# Patient Record
Sex: Female | Born: 1990 | Hispanic: Yes | Marital: Married | Smoking: Former smoker
Health system: Southern US, Community
[De-identification: ages and names within clinical notes are randomized; demographics above are authoritative.]

## PROBLEM LIST (undated history)

## (undated) ENCOUNTER — Inpatient Hospital Stay: Payer: Self-pay

## (undated) DIAGNOSIS — B9689 Other specified bacterial agents as the cause of diseases classified elsewhere: Secondary | ICD-10-CM

## (undated) DIAGNOSIS — A749 Chlamydial infection, unspecified: Secondary | ICD-10-CM

## (undated) DIAGNOSIS — E669 Obesity, unspecified: Secondary | ICD-10-CM

## (undated) DIAGNOSIS — N760 Acute vaginitis: Secondary | ICD-10-CM

## (undated) HISTORY — DX: Obesity, unspecified: E66.9

## (undated) HISTORY — DX: Chlamydial infection, unspecified: A74.9

## (undated) HISTORY — PX: CHOLECYSTECTOMY: SHX55

---
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
## 2008-06-22 ENCOUNTER — Emergency Department (HOSPITAL_COMMUNITY): Admission: EM | Admit: 2008-06-22 | Discharge: 2008-06-22 | Payer: Self-pay | Admitting: Emergency Medicine

## 2009-12-12 ENCOUNTER — Emergency Department: Payer: Self-pay | Admitting: Emergency Medicine

## 2009-12-22 ENCOUNTER — Emergency Department: Payer: Self-pay | Admitting: Emergency Medicine

## 2011-01-23 ENCOUNTER — Emergency Department: Payer: Self-pay | Admitting: Emergency Medicine

## 2011-03-28 NOTE — Op Note (Signed)
NAMESHAKARIA, RAPHAEL              ACCOUNT NO.:  [id number]   MEDICAL RECORD NO.:  [id number]          PATIENT TYPE:  MAT   LOCATION:  [facility]                          FACILITY:  [facility]   PHYSICIAN:  Phil D. Okey Dupre, M.D.     DATE OF BIRTH:  June 27, 1991   DATE OF PROCEDURE:  03/14/2005  DATE OF DISCHARGE:                                 OPERATIVE REPORT   PREOPERATIVE DIAGNOSIS:  Incomplete abortion.   POSTOPERATIVE DIAGNOSIS:  Incomplete abortion.   PROCEDURE:  Dilatation and evacuation.   SURGEON:  Javier Glazier. Rose, M.D.   ESTIMATED BLOOD LOSS:  Less than 10 cc.   SPECIMENS:  Products of conception sent to pathology.   ANESTHESIA:  MAC plus local.   POSTOPERATIVE CONDITION:  Satisfactory.   DESCRIPTION OF PROCEDURE:  Under satisfactory MAC sedation, the perineum was  prepped and draped in the usual sterile manner.   Bimanual pelvic examination revealed the uterus about [redacted] weeks gestational  size, retroverted, freely moveable, with normal free adnexa and cervix that  easily admitted a fingertip.  A weighted speculum was placed in the  posterior fourchette of the bladder.  BUS within normal limits.  The vagina  was clean and well-rugated.  There was a moderate amount of blood in the  vaginal vault.  The cervix was dilated to approximately 1 cm.  The uterine  cavity was sounded to a depth of 9 cm and the cervical os dilated to a #10  Hegar dilator which was easily inserted.  A suction curette of #10 was used  to evacuate the remaining endometrial contents which was sent for  pathological diagnosis.  There was minimal bleeding during the procedure.  Prior to the curettage, 10 cc of 1% Xylocaine was placed in each of the  lateral paracervical areas of the cervix for patient comfort.   The patient was transferred to the recovery room in satisfactory condition.      PDR/MEDQ  D:  03/14/2005  T:  03/14/2005  Job:  [id number]

## 2011-10-06 ENCOUNTER — Observation Stay: Payer: Self-pay | Admitting: Obstetrics and Gynecology

## 2011-11-22 ENCOUNTER — Inpatient Hospital Stay: Payer: Self-pay | Admitting: Obstetrics & Gynecology

## 2011-11-22 LAB — CBC WITH DIFFERENTIAL/PLATELET
Basophil #: 0 10*3/uL (ref 0.0–0.1)
Eosinophil #: 0 10*3/uL (ref 0.0–0.7)
HCT: 35.6 % (ref 35.0–47.0)
HGB: 12 g/dL (ref 12.0–16.0)
Lymphocyte #: 1.1 10*3/uL (ref 1.0–3.6)
MCV: 93 fL (ref 80–100)
Monocyte #: 0.4 10*3/uL (ref 0.0–0.7)
Neutrophil %: 82.1 %
Platelet: 157 10*3/uL (ref 150–440)
RBC: 3.85 10*6/uL (ref 3.80–5.20)
RDW: 14.8 % — ABNORMAL HIGH (ref 11.5–14.5)
WBC: 8.8 10*3/uL (ref 3.6–11.0)

## 2012-05-31 ENCOUNTER — Emergency Department: Payer: Self-pay | Admitting: Emergency Medicine

## 2012-05-31 LAB — URINALYSIS, COMPLETE
Bacteria: NONE SEEN
Blood: NEGATIVE
Glucose,UR: NEGATIVE mg/dL (ref 0–75)
Nitrite: NEGATIVE
Protein: NEGATIVE

## 2013-03-24 ENCOUNTER — Emergency Department: Payer: Self-pay | Admitting: Emergency Medicine

## 2013-03-24 LAB — CBC WITH DIFFERENTIAL/PLATELET
Basophil #: 0 10*3/uL (ref 0.0–0.1)
Eosinophil #: 0 10*3/uL (ref 0.0–0.7)
HGB: 13.4 g/dL (ref 12.0–16.0)
Lymphocyte %: 11.2 %
MCV: 89 fL (ref 80–100)
Neutrophil %: 85.1 %
RBC: 4.41 10*6/uL (ref 3.80–5.20)

## 2013-03-24 LAB — COMPREHENSIVE METABOLIC PANEL
Albumin: 3.8 g/dL (ref 3.4–5.0)
BUN: 16 mg/dL (ref 7–18)
Calcium, Total: 9.1 mg/dL (ref 8.5–10.1)
Creatinine: 0.81 mg/dL (ref 0.60–1.30)
EGFR (Non-African Amer.): >60
Glucose: 117 mg/dL — ABNORMAL HIGH (ref 65–99)
Osmolality: 274 (ref 275–301)
Potassium: 3.5 mmol/L (ref 3.5–5.1)
SGPT (ALT): 22 U/L (ref 12–78)

## 2013-03-24 LAB — URINALYSIS, COMPLETE
Bilirubin,UR: NEGATIVE
Blood: NEGATIVE
Glucose,UR: NEGATIVE mg/dL (ref 0–75)
Ketone: NEGATIVE
Ph: 5 (ref 4.5–8.0)
Protein: NEGATIVE
RBC,UR: NONE SEEN /HPF (ref 0–5)
Squamous Epithelial: 2
WBC UR: 3 /HPF (ref 0–5)

## 2013-03-24 LAB — LIPASE, BLOOD: Lipase: 174 U/L (ref 73–393)

## 2013-03-24 LAB — PREGNANCY, URINE: Pregnancy Test, Urine: NEGATIVE m[IU]/mL

## 2013-04-13 ENCOUNTER — Ambulatory Visit: Payer: Self-pay | Admitting: Surgery

## 2014-03-23 ENCOUNTER — Emergency Department: Payer: Self-pay | Admitting: Emergency Medicine

## 2014-10-25 ENCOUNTER — Emergency Department: Payer: Self-pay | Admitting: Emergency Medicine

## 2014-11-04 ENCOUNTER — Emergency Department: Payer: Self-pay | Admitting: Emergency Medicine

## 2015-03-02 NOTE — Op Note (Signed)
PATIENT NAME:  Anne Cochran, Anne Cochran MR#:  [id number] DATE OF BIRTH:  [birth year]  ATTENDING PHYSICIAN:  Dr. Salome Holmeshris Jerrian Mells  DATE OF SURGERY:  04/13/2013  PREOPERATIVE DIAGNOSIS:  Symptomatic cholelithiasis.   POSTOPERATIVE DIAGNOSIS:  Symptomatic cholelithiasis.   PROCEDURE PERFORMED:  Laparoscopic cholecystectomy.   ANESTHESIA:  General.   ESTIMATED BLOOD LOSS:  5 mL.   COMPLICATIONS:  None.   SPECIMEN:  Gallbladder.   INDICATION FOR SURGERY:  Anne Cochran is a pleasant, 24 year old female with history of right upper quadrant pain and gallstones. We believe that her pain was biliary in nature, and thus brought her to the Operating Room for laparoscopic cholecystectomy.   DETAILS OF PROCEDURE ARE AS FOLLOWS:  Informed consent was obtained. Anne Cochran was brought to the Operating Room suite. She was laid supine on the Operating Room table. She was induced, endotracheal tube was placed, general anesthesia was administered. Her abdomen was prepped and draped in standard surgical fashion. A timeout was then performed correctly identifying the patient name, operative site and procedure to be performed. A supraumbilical incision was made. This was deepened down to the fascia. The fascia was incised. The peritoneum was entered. Two stay sutures were placed through the supraumbilical fascia. A Hassan trocar was placed through the fasciotomy The abdomen was insufflated. The gallbladder was evaluated. It did not appear to be acutely inflamed. An 11 mm epigastric port was placed under direct visualization. Two 5 mm subcostal ports were placed at the midclavicular and anterior axillary line. The gallbladder was then retracted over the dome of the liver. The cystic duct and cystic artery were dissected out. There was not one dominant artery, but the critical view was obtained. The cystic duct was clipped and ligated. The smaller cystic artery branches were ligated using electrocautery. The gallbladder was then  taken off the gallbladder fossa. The gallbladder was then taken out with an Endo Catch bag through the supraumbilical port. The gallbladder fossa was examined. It was made hemostatic with bovie electrocautery. The abdomen was irrigated. The supraumbilical port was closed using a 0 Vicryl figure-of-eight. The skin was then closed using a deep dermal 4-0 Monocryl interrupted sutures. Steri-Strips, Telfa gauze and Tegaderm were then placed over the wounds to complete the dressing. The patient was then awoken, extubated and brought to the postanesthesia care unit. There were no immediate complications. Needle, sponge and instrument count was correct at the end of the procedure.    ____________________________ Si Raiderhristopher A. Taletha Twiford, MD cal:mr D: 04/13/2013 15:07:54 ET T: 04/13/2013 19:14:38 ET JOB#: [id number]  cc: Cristal Deerhristopher A. Kynlei Piontek, MD, <Dictator> Jarvis NewcomerHRISTOPHER A Roland Lipke MD ELECTRONICALLY SIGNED 04/18/2013 20:57

## 2015-03-20 NOTE — H&P (Signed)
L&D Evaluation:  History Expanded:   HPI 24 yo G3P1011 at 38+ weeks [facility]02/2011 presenting with spontaneous rupture of membranes at 1230, min contractions.  +FM. Prior delivery was a term singleton.  Her pregnancy has been uncomplicated other than two prior UTI's.    Presents with abdominal pain    Patient's Medical History No Chronic Illness    Patient's Surgical History none  Previous C-Section    Medications Pre Natal Vitamins    Allergies PCN, Sulfa    Social History none    Family History Non-Contributory   ROS:   ROS All systems were reviewed.  HEENT, CNS, GI, GU, Respiratory, CV, Renal and Musculoskeletal systems were found to be normal.   Exam:   Vital Signs stable    General no apparent distress    Mental Status clear    Chest clear    Heart normal sinus rhythm, no murmur/gallop/rubs    Abdomen gravid, non-tender    Estimated Fetal Weight Average for gestational age    Back no CVAT    Edema no edema    Pelvic no external lesions, 3/75/-3, VTX    Mebranes Ruptured    FHT normal rate with no decels, 140    Ucx absent    Skin dry   Impression:   Impression 24 yo G3P1011 with spontaneous rupture of membranes at term   Plan:   Plan EFM/NST, monitor contractions and for cervical change, fluids    Comments Pitocin as no contractions seen or felt Analgesia as needed Anticipate vag delivery   Electronic Signatures: Letitia LibraHarris, Robert Paul (MD)  (Signed 12-Jan-13 15:43)  Authored: L&D Evaluation   Last Updated: 12-Jan-13 15:43 by Letitia LibraHarris, Robert Paul (MD)

## 2016-03-06 ENCOUNTER — Emergency Department: Payer: Medicaid Other

## 2016-03-06 ENCOUNTER — Emergency Department
Admission: EM | Admit: 2016-03-06 | Discharge: 2016-03-07 | Disposition: A | Discharge status: Home / Self Care | Payer: Medicaid Other | Attending: Emergency Medicine | Admitting: Emergency Medicine

## 2016-03-06 ENCOUNTER — Encounter: Payer: Self-pay | Admitting: Emergency Medicine

## 2016-03-06 DIAGNOSIS — S93401A Sprain of unspecified ligament of right ankle, initial encounter: Secondary | ICD-10-CM

## 2016-03-06 DIAGNOSIS — Y939 Activity, unspecified: Secondary | ICD-10-CM | POA: Diagnosis not present

## 2016-03-06 DIAGNOSIS — M25571 Pain in right ankle and joints of right foot: Secondary | ICD-10-CM | POA: Diagnosis present

## 2016-03-06 DIAGNOSIS — Y929 Unspecified place or not applicable: Secondary | ICD-10-CM | POA: Insufficient documentation

## 2016-03-06 DIAGNOSIS — Y999 Unspecified external cause status: Secondary | ICD-10-CM | POA: Insufficient documentation

## 2016-03-06 DIAGNOSIS — W1849XA Other slipping, tripping and stumbling without falling, initial encounter: Secondary | ICD-10-CM | POA: Insufficient documentation

## 2016-03-06 MED ORDER — NAPROXEN 500 MG PO TABS: 500.0000 mg | ORAL_TABLET | Freq: Two times a day (BID) | ORAL | Status: DC

## 2016-03-06 NOTE — ED Provider Notes (Signed)
[facility] Emergency Department Provider Note  ____________________________________________  Time seen: Approximately 11:49 PM  I have reviewed the triage vital signs and the nursing notes.   HISTORY  Chief Complaint Ankle Pain    HPI Anne HotterJessica Cochran is a 25 y.o. female , NAD, presents to the emergency department with one-day history of right ankle pain. States she slipped yesterday and twisted the right ankle. Has had persistent pain, swelling and bruising about the outside of the right ankle and foot. Denies any numbness, weakness, tingling. Has not treated her injury at home at this time. Denies any open wounds or lacerations.   History reviewed. No pertinent past medical history.  There are no active problems to display for this patient.   Past Surgical History  Procedure Laterality Date  . Cholecystectomy      Current Outpatient Rx  Name  Route  Sig  Dispense  Refill  . naproxen (NAPROSYN) 500 MG tablet   Oral   Take 1 tablet (500 mg total) by mouth 2 (two) times daily with a meal.   14 tablet   0     Allergies Penicillins  No family history on file.  Social History Social History  Substance Use Topics  . Smoking status: Never Smoker   . Smokeless tobacco: None  . Alcohol Use: No     Review of Systems  Constitutional: No fatigue Eyes: No visual changes.  Cardiovascular: No chest pain. Respiratory:  No shortness of breath.  Gastrointestinal: No abdominal pain.  No nausea, vomiting.  Musculoskeletal: Positive right ankle and foot pain.  Skin: Positive swelling, bruising about right ankle and foot. Negative for rash, open wounds, lacerations. Neurological: Negative for headaches, focal weakness or numbness. No tingling. 10-point ROS otherwise negative.  ____________________________________________   PHYSICAL EXAM:  VITAL SIGNS: ED Triage Vitals  Enc Vitals Group     BP 03/06/16 2319 118/74 mmHg     Pulse Rate 03/06/16  2319 88     Resp 03/06/16 2319 20     Temp 03/06/16 2319 97.9 F (36.6 C)     Temp Source 03/06/16 2319 Oral     SpO2 03/06/16 2319 100 %     Weight 03/06/16 2319 214 lb (97.07 kg)     Height 03/06/16 2319 5\' 2"  (1.575 m)     Head Cir --      Peak Flow --      Pain Score 03/06/16 2317 4     Pain Loc --      Pain Edu? --      Excl. in GC? --      Constitutional: Alert and oriented. Well appearing and in no acute distress. Eyes: Conjunctivae are normal. Head: Atraumatic. Cardiovascular: Good peripheral circulation with 2+ pulses noted in the right lower extremity. Capillary refill less than 3 seconds in all digits of the right lower extremity. Respiratory: Normal respiratory effort without tachypnea or retractions. Musculoskeletal: Pain to palpation about the lateral right ankle. Full range of motion of the right ankle with some pain with inversion and full flexion. No laxity with anterior and posterior drawer. No laxity with varus or valgus stress. Ocean of the right toes. No lower extremity edema.  No joint effusions. Neurologic:  Normal speech and language. No gross focal neurologic deficits are appreciated. Sensation is grossly intact about the right lower extremity. Skin:  Skin about the lateral portion of the right ankle is moderately swollen with diffuse blue ecchymosis. No open wounds or lesions are  noted. Skin is warm, dry and intact. No rash noted. Psychiatric: Mood and affect are normal. Speech and behavior are normal. Patient exhibits appropriate insight and judgement.   ____________________________________________   LABS  None  ____________________________________________  EKG  None ____________________________________________  RADIOLOGY I have personally viewed and evaluated these images (plain radiographs) as part of my medical decision making, as well as reviewing the written report by the radiologist.  Dg Ankle Complete Right  03/06/2016  CLINICAL DATA:   Patient slipped and twisted right ankle. Pain with bruising. Initial encounter. EXAM: RIGHT ANKLE - COMPLETE 3+ VIEW COMPARISON:  None. FINDINGS: Soft tissue swelling overlying the lateral malleolus. Corticated ossific density about the inferior aspect of the lateral malleolus, likely sequelae of prior fracture. Talar dome is intact. No evidence for acute fracture. IMPRESSION: Soft tissue swelling overlying the lateral malleolus. No evidence for acute fracture. Corticated ossific density about the inferior aspect of the lateral malleolus, likely sequelae of remote prior trauma. Electronically Signed   By: Annia Belt M.D.   On: 03/06/2016 23:43    ____________________________________________    PROCEDURES  Procedure(s) performed: None   Medications - No data to display   ____________________________________________   INITIAL IMPRESSION / ASSESSMENT AND PLAN / ED COURSE  Pertinent imaging results that were available during my care of the patient were reviewed by me and considered in my medical decision making (see chart for details).  Patient's diagnosis is consistent with right ankle sprain. Patient will be discharged home with prescriptions for naproxen to take as directed. Patient is also advised to apply ice to the affected area 20 minutes 3-4 times daily and keep the right ankle elevated when not ambulating. Patient was placed in an Ace wrap and given crutches to assist in ambulation. Patient is to follow up with Dr. Lutricia Horsfall in orthopedics if no improvement noted within 5-7 days. Patient is given ED precautions to return to the ED for any worsening or new symptoms.      ____________________________________________  FINAL CLINICAL IMPRESSION(S) / ED DIAGNOSES  Final diagnoses:  Right ankle sprain, initial encounter      NEW MEDICATIONS STARTED DURING THIS VISIT:  New Prescriptions   NAPROXEN (NAPROSYN) 500 MG TABLET    Take 1 tablet (500 mg total) by mouth 2 (two) times  daily with a meal.         Hope Pigeon, PA-C 03/07/16 0001  Jeanmarie Plant, MD 03/07/16 2326

## 2016-03-06 NOTE — Discharge Instructions (Signed)
Ankle Sprain °An ankle sprain is an injury to the strong, fibrous tissues (ligaments) that hold the bones of your ankle joint together.  °CAUSES °An ankle sprain is usually caused by a fall or by twisting your ankle. Ankle sprains most commonly occur when you step on the outer edge of your foot, and your ankle turns inward. People who participate in sports are more prone to these types of injuries.  °SYMPTOMS  °· Pain in your ankle. The pain may be present at rest or only when you are trying to stand or walk. °· Swelling. °· Bruising. Bruising may develop immediately or within 1 to 2 days after your injury. °· Difficulty standing or walking, particularly when turning corners or changing directions. °DIAGNOSIS  °Your caregiver will ask you details about your injury and perform a physical exam of your ankle to determine if you have an ankle sprain. During the physical exam, your caregiver will press on and apply pressure to specific areas of your foot and ankle. Your caregiver will try to move your ankle in certain ways. An X-ray exam may be done to be sure a bone was not broken or a ligament did not separate from one of the bones in your ankle (avulsion fracture).  °TREATMENT  °Certain types of braces can help stabilize your ankle. Your caregiver can make a recommendation for this. Your caregiver may recommend the use of medicine for pain. If your sprain is severe, your caregiver may refer you to a surgeon who helps to restore function to parts of your skeletal system (orthopedist) or a physical therapist. °HOME CARE INSTRUCTIONS  °· Apply ice to your injury for 1-2 days or as directed by your caregiver. Applying ice helps to reduce inflammation and pain. °· Put ice in a plastic bag. °· Place a towel between your skin and the bag. °· Leave the ice on for 15-20 minutes at a time, every 2 hours while you are awake. °· Only take over-the-counter or prescription medicines for pain, discomfort, or fever as directed by  your caregiver. °· Elevate your injured ankle above the level of your heart as much as possible for 2-3 days. °· If your caregiver recommends crutches, use them as instructed. Gradually put weight on the affected ankle. Continue to use crutches or a cane until you can walk without feeling pain in your ankle. °· If you have a plaster splint, wear the splint as directed by your caregiver. Do not rest it on anything harder than a pillow for the first 24 hours. Do not put weight on it. Do not get it wet. You may take it off to take a shower or bath. °· You may have been given an elastic bandage to wear around your ankle to provide support. If the elastic bandage is too tight (you have numbness or tingling in your foot or your foot becomes cold and blue), adjust the bandage to make it comfortable. °· If you have an air splint, you may blow more air into it or let air out to make it more comfortable. You may take your splint off at night and before taking a shower or bath. Wiggle your toes in the splint several times per day to decrease swelling. °SEEK MEDICAL CARE IF:  °· You have rapidly increasing bruising or swelling. °· Your toes feel extremely cold or you lose feeling in your foot. °· Your pain is not relieved with medicine. °SEEK IMMEDIATE MEDICAL CARE IF: °· Your toes are numb or blue. °·   You have severe pain that is increasing. °MAKE SURE YOU:  °· Understand these instructions. °· Will watch your condition. °· Will get help right away if you are not doing well or get worse. °  °This information is not intended to replace advice given to you by your health care provider. Make sure you discuss any questions you have with your health care provider. °  °Document Released: 10/27/2005 Document Revised: 11/17/2014 Document Reviewed: 11/08/2011 °Elsevier Interactive Patient Education ©2016 [employer] ° °Cryotherapy °Cryotherapy is when you put ice on your injury. Ice helps lessen pain and puffiness (swelling) after an  injury. Ice works the best when you start using it in the first 24 to 48 hours after an injury. °HOME CARE °· Put a dry or damp towel between the ice pack and your skin. °· You may press gently on the ice pack. °· Leave the ice on for no more than 10 to 20 minutes at a time. °· Check your skin after 5 minutes to make sure your skin is okay. °· Rest at least 20 minutes between ice pack uses. °· Stop using ice when your skin loses feeling (numbness). °· Do not use ice on someone who cannot tell you when it hurts. This includes small children and people with memory problems (dementia). °GET HELP RIGHT AWAY IF: °· You have white spots on your skin. °· Your skin turns blue or pale. °· Your skin feels waxy or hard. °· Your puffiness gets worse. °MAKE SURE YOU:  °· Understand these instructions. °· Will watch your condition. °· Will get help right away if you are not doing well or get worse. °  °This information is not intended to replace advice given to you by your health care provider. Make sure you discuss any questions you have with your health care provider. °  °Document Released: 04/14/2008 Document Revised: 01/19/2012 Document Reviewed: 06/19/2011 °Elsevier Interactive Patient Education ©2016 [employer] ° °Elastic Bandage and RICE °WHAT DOES AN ELASTIC BANDAGE DO? °Elastic bandages come in different shapes and sizes. They generally provide support to your injury and reduce swelling while you are healing, but they can perform different functions. Your health care provider will help you to decide what is best for your protection, recovery, or rehabilitation following an injury. °WHAT ARE SOME GENERAL TIPS FOR USING AN ELASTIC BANDAGE? °· Use the bandage as directed by the maker of the bandage that you are using. °· Do not wrap the bandage too tightly. This may cut off the circulation in the arm or leg in the area below the bandage. °¨ If part of your body beyond the bandage becomes blue, numb, cold, swollen, or is  more painful, your bandage is most likely too tight. If this occurs, remove your bandage and reapply it more loosely. °· See your health care provider if the bandage seems to be making your problems worse rather than better. °· An elastic bandage should be removed and reapplied every 3-4 hours or as directed by your health care provider. °WHAT IS RICE? °The routine care of many injuries includes rest, ice, compression, and elevation (RICE therapy).  °Rest °Rest is required to allow your body to heal. Generally, you can resume your routine activities when you are comfortable and have been given permission by your health care provider. °Ice °Icing your injury helps to keep the swelling down and it reduces pain. Do not apply ice directly to your skin. °· Put ice in a plastic bag. °· Place   a towel between your skin and the bag. °· Leave the ice on for 20 minutes, 2-3 times per day. °Do this for as long as you are directed by your health care provider. °Compression °Compression helps to keep swelling down, gives support, and helps with discomfort. Compression may be done with an elastic bandage. °Elevation °Elevation helps to reduce swelling and it decreases pain. If possible, your injured area should be placed at or above the level of your heart or the center of your chest. °WHEN SHOULD I SEEK MEDICAL CARE? °You should seek medical care if: °· You have persistent pain and swelling. °· Your symptoms are getting worse rather than improving. °These symptoms may indicate that further evaluation or further X-rays are needed. Sometimes, X-rays may not show a small broken bone (fracture) until a number of days later. Make a follow-up appointment with your health care provider. Ask when your X-ray results will be ready. Make sure that you get your X-ray results. °WHEN SHOULD I SEEK IMMEDIATE MEDICAL CARE? °You should seek immediate medical care if: °· You have a sudden onset of severe pain at or below the area of your  injury. °· You develop redness or increased swelling around your injury. °· You have tingling or numbness at or below the area of your injury that does not improve after you remove the elastic bandage. °  °This information is not intended to replace advice given to you by your health care provider. Make sure you discuss any questions you have with your health care provider. °  °Document Released: 04/18/2002 Document Revised: 07/18/2015 Document Reviewed: 06/12/2014 °Elsevier Interactive Patient Education ©2016 [employer] ° °

## 2016-03-06 NOTE — ED Notes (Signed)
Pt to triage via w/c with no distress noted; st slipped and twisted right ankle PTA, c/o persistent pain

## 2016-04-23 ENCOUNTER — Encounter: Payer: Self-pay | Admitting: *Deleted

## 2016-04-23 ENCOUNTER — Emergency Department
Admission: EM | Admit: 2016-04-23 | Discharge: 2016-04-23 | Disposition: A | Discharge status: Home / Self Care | Payer: Medicaid Other | Attending: Emergency Medicine | Admitting: Emergency Medicine

## 2016-04-23 DIAGNOSIS — N760 Acute vaginitis: Secondary | ICD-10-CM | POA: Diagnosis not present

## 2016-04-23 LAB — WET PREP, GENITAL
Clue Cells Wet Prep HPF POC: NONE SEEN
SPERM: NONE SEEN
TRICH WET PREP: NONE SEEN
Yeast Wet Prep HPF POC: NONE SEEN

## 2016-04-23 LAB — CHLAMYDIA/NGC RT PCR (ARMC ONLY)
Chlamydia Tr: DETECTED — AB
N gonorrhoeae: NOT DETECTED

## 2016-04-23 MED ORDER — CEFTRIAXONE SODIUM 250 MG IJ SOLR
250.0000 mg | Freq: Once | INTRAMUSCULAR | Status: AC
  Administered 2016-04-23: 250 mg via INTRAMUSCULAR
  Filled 2016-04-23: qty 250

## 2016-04-23 MED ORDER — AZITHROMYCIN 500 MG PO TABS
1000.0000 mg | ORAL_TABLET | Freq: Once | ORAL | Status: AC
  Administered 2016-04-23: 1000 mg via ORAL
  Filled 2016-04-23: qty 2

## 2016-04-23 NOTE — Discharge Instructions (Signed)
Vaginitis °Vaginitis is an inflammation of the vagina. It is most often caused by a change in the normal balance of the bacteria and yeast that live in the vagina. This change in balance causes an overgrowth of certain bacteria or yeast, which causes the inflammation. There are different types of vaginitis, but the most common types are: °· Bacterial vaginosis. °· Yeast infection (candidiasis). °· Trichomoniasis vaginitis. This is a sexually transmitted infection (STI). °· Viral vaginitis. °· Atrophic vaginitis. °· Allergic vaginitis. °CAUSES  °The cause depends on the type of vaginitis. Vaginitis can be caused by: °· Bacteria (bacterial vaginosis). °· Yeast (yeast infection). °· A parasite (trichomoniasis vaginitis) °· A virus (viral vaginitis). °· Low hormone levels (atrophic vaginitis). Low hormone levels can occur during pregnancy, breastfeeding, or after menopause. °· Irritants, such as bubble baths, scented tampons, and feminine sprays (allergic vaginitis). °Other factors can change the normal balance of the yeast and bacteria that live in the vagina. These include: °· Antibiotic medicines. °· Poor hygiene. °· Diaphragms, vaginal sponges, spermicides, birth control pills, and intrauterine devices (IUD). °· Sexual intercourse. °· Infection. °· Uncontrolled diabetes. °· A weakened immune system. °SYMPTOMS  °Symptoms can vary depending on the cause of the vaginitis. Common symptoms include: °· Abnormal vaginal discharge. °· The discharge is white, gray, or yellow with bacterial vaginosis. °· The discharge is thick, white, and cheesy with a yeast infection. °· The discharge is frothy and yellow or greenish with trichomoniasis. °· A bad vaginal odor. °· The odor is fishy with bacterial vaginosis. °· Vaginal itching, pain, or swelling. °· Painful intercourse. °· Pain or burning when urinating. °Sometimes, there are no symptoms. °TREATMENT  °Treatment will vary depending on the type of infection.  °· Bacterial  vaginosis and trichomoniasis are often treated with antibiotic creams or pills. °· Yeast infections are often treated with antifungal medicines, such as vaginal creams or suppositories. °· Viral vaginitis has no cure, but symptoms can be treated with medicines that relieve discomfort. Your sexual partner should be treated as well. °· Atrophic vaginitis may be treated with an estrogen cream, pill, suppository, or vaginal ring. If vaginal dryness occurs, lubricants and moisturizing creams may help. You may be told to avoid scented soaps, sprays, or douches. °· Allergic vaginitis treatment involves quitting the use of the product that is causing the problem. Vaginal creams can be used to treat the symptoms. °HOME CARE INSTRUCTIONS  °· Take all medicines as directed by your caregiver. °· Keep your genital area clean and dry. Avoid soap and only rinse the area with water. °· Avoid douching. It can remove the healthy bacteria in the vagina. °· Do not use tampons or have sexual intercourse until your vaginitis has been treated. Use sanitary pads while you have vaginitis. °· Wipe from front to back. This avoids the spread of bacteria from the rectum to the vagina. °· Let air reach your genital area. °¨ Wear cotton underwear to decrease moisture buildup. °¨ Avoid wearing underwear while you sleep until your vaginitis is gone. °¨ Avoid tight pants and underwear or nylons without a cotton panel. °¨ Take off wet clothing (especially bathing suits) as soon as possible. °· Use mild, non-scented products. Avoid using irritants, such as: °¨ Scented feminine sprays. °¨ Fabric softeners. °¨ Scented detergents. °¨ Scented tampons. °¨ Scented soaps or bubble baths. °· Practice safe sex and use condoms. Condoms may prevent the spread of trichomoniasis and viral vaginitis. °SEEK MEDICAL CARE IF:  °· You have abdominal pain. °· You   have a fever or persistent symptoms for more than 2-3 days.  You have a fever and your symptoms suddenly  get worse.   This information is not intended to replace advice given to you by your health care provider. Make sure you discuss any questions you have with your health care provider.   Document Released: 08/24/2007 Document Revised: 03/13/2015 Document Reviewed: 04/08/2012 Elsevier Interactive Patient Education 2016 [employer]  Sexually Transmitted Disease A sexually transmitted disease (STD) is a disease or infection often passed to another person during sex. However, STDs can be passed through nonsexual ways. An STD can be passed through:  Spit (saliva).  Semen.  Blood.  Mucus from the vagina.  Pee (urine). HOW CAN I LESSEN MY CHANCES OF GETTING AN STD?  Use:  Latex condoms.  Water-soluble lubricants with condoms. Do not use petroleum jelly or oils.  Dental dams. These are small pieces of latex that are used as a barrier during oral sex.  Avoid having more than one sex partner.  Do not have sex with someone who has other sex partners.  Do not have sex with anyone you do not know or who is at high risk for an STD.  Avoid risky sex that can break your skin.  Do not have sex if you have open sores on your mouth or skin.  Avoid drinking too much alcohol or taking illegal drugs. Alcohol and drugs can affect your good judgment.  Avoid oral and anal sex acts.  Get shots (vaccines) for HPV and hepatitis.  If you are at risk of being infected with HIV, it is advised that you take a certain medicine daily to prevent HIV infection. This is called pre-exposure prophylaxis (PrEP). You may be at risk if:  You are a man who has sex with other men (MSM).  You are attracted to the opposite sex (heterosexual) and are having sex with more than one partner.  You take drugs with a needle.  You have sex with someone who has HIV.  Talk with your doctor about if you are at high risk of being infected with HIV. If you begin to take PrEP, get tested for HIV first. Get tested every  3 months for as long as you are taking PrEP.  Get tested for STDs every year if you are sexually active. If you are treated for an STD, get tested again 3 months after you are treated. WHAT SHOULD I DO IF I THINK I HAVE AN STD?  See your doctor.  Tell your sex partner(s) that you have an STD. They should be tested and treated.  Do not have sex until your doctor says it is okay. WHEN SHOULD I GET HELP? Get help right away if:  You have bad belly (abdominal) pain.  You are a man and have puffiness (swelling) or pain in your testicles.  You are a woman and have puffiness in your vagina.   This information is not intended to replace advice given to you by your health care provider. Make sure you discuss any questions you have with your health care provider.   Document Released: 12/04/2004 Document Revised: 11/17/2014 Document Reviewed: 04/22/2013 Elsevier Interactive Patient Education 2016 [employer] Inc[employer] Chlamydia, Female    Chlamydia is an infection. It is spread through sexual contact. Chlamydia can be in different areas of the body. These areas include the cervix, urethra, throat, or rectum. You may not know you have chlamydia because many people never develop the symptoms. Chlamydia is  not difficult to treat once you know you have it. However, if it is left untreated, chlamydia can lead to more serious health problems.  CAUSES  Chlamydia is caused by bacteria. It is a sexually transmitted disease. It is passed from an infected partner during intimate contact. This contact could be with the genitals, mouth, or rectal area. Chlamydia can also be passed from mothers to babies during birth.  SIGNS AND SYMPTOMS  There may not be any symptoms. This is often the case early in the infection. If symptoms develop, they may include:  Mild pain and discomfort when urinating.  Redness, soreness, and swelling (inflammation) of the rectum.  Vaginal discharge.  Painful intercourse.  Abdominal  pain.  Bleeding between menstrual periods. DIAGNOSIS  To diagnose this infection, your health care provider will do a pelvic exam. Cultures will be taken of the vagina, cervix, urine, and possibly the rectum to verify the diagnosis.  TREATMENT  You will be given antibiotic medicines. If you are pregnant, certain types of antibiotics will need to be avoided. Any sexual partners should also be treated, even if they do not show symptoms. Your health care provider may test you for infection again 3 months after treatment.  HOME CARE INSTRUCTIONS  Take your antibiotic medicine as directed by your health care provider. Finish the antibiotic even if you start to feel better.  Take medicines only as directed by your health care provider.  Inform any sexual partners about the infection. They should also be treated.  Do not have sexual contact until your health care provider tells you it is okay.  Get plenty of rest.  Eat a well-balanced diet.  Drink enough fluids to keep your urine clear or pale yellow.  Keep all follow-up visits as directed by your health care provider. SEEK MEDICAL CARE IF:  You have painful urination.  You have abdominal pain.  You have vaginal discharge.  You have painful sexual intercourse.  You have bleeding between periods and after sex.  You have a fever. SEEK IMMEDIATE MEDICAL CARE IF:  You experience nausea or vomiting.  You experience excessive sweating (diaphoresis).  You have difficulty swallowing. This information is not intended to replace advice given to you by your health care provider. Make sure you discuss any questions you have with your health care provider.  Document Released: 08/06/2005 Document Revised: 07/18/2015 Document Reviewed: 07/04/2013  Elsevier Interactive Patient Education [employer]    Gonorrhea  Gonorrhea is an infection that can cause serious problems. If left untreated, the infection may:  Damage the female or female organs.    Cause women to be unable to have children (sterility).  Harm a fetus if the infected woman is pregnant.  It is important to get treatment for gonorrhea as soon as possible. It is also necessary that all your sexual partners be tested for the infection.  CAUSES  Gonorrhea is caused by bacteria called Neisseria gonorrhoeae. The infection is spread from person to person, usually by sexual contact (such as by anal, vaginal, or oral means). A newborn can contract the infection from his or her mother during birth.  RISK FACTORS  Being a woman younger than 27 years of age who is sexually active.  Being a woman 34 years of age or older who has:  A new sex partner.  More than one sex partner.  A sex partner who has a sexually transmitted disease (STD). Using condoms inconsistently.  Currently having, or having previously had, an STD.  Exchanging sex or money or drugs. SYMPTOMS  Some people with gonorrhea do not have symptoms. Symptoms may be different in females and males.  Females  The most common symptoms are:  Pain in the lower abdomen.  Fever with or without chills.  Other symptoms include:  Abnormal vaginal discharge.  Painful intercourse.  Burning or itching of the vagina or lips of the vagina.  Abnormal vaginal bleeding.  Pain when urinating.  Long-lasting (chronic) pain in the lower abdomen, especially during menstruation or intercourse.  Inability to become pregnant.  Going into premature labor.  Irritation, pain, bleeding, or discharge from the rectum. This may occur if the infection was spread by anal sex.  Sore throat or swollen lymph nodes in the neck. This may occur if the infection was spread by oral sex.  Males  The most common symptoms are:  Discharge from the penis.  Pain or burning during urination.  Pain or swelling in the testicles. Other symptoms may include:  Irritation, pain, bleeding, or discharge from the rectum. This may occur if the infection was spread by  anal sex.  Sore throat, fever, or swollen lymph nodes in the neck. This may occur if the infection was spread by oral sex.  DIAGNOSIS  A diagnosis is made after a physical exam is done and a sample of discharge is examined under a microscope for the presence of the bacteria. The discharge may be taken from the urethra, cervix, throat, or rectum.  TREATMENT  Gonorrhea is treated with antibiotic medicines. It is important for treatment to begin as soon as possible. Early treatment may prevent some problems from developing. Do not have sex. Avoid all types of sexual activity for 7 days after treatment is complete and until any sex partners have been treated.  HOME CARE INSTRUCTIONS  Take medicines only as directed by your health care provider.  Take your antibiotic medicine as directed by your health care provider. Finish the antibiotic even if you start to feel better. Incomplete treatment will put you at risk for continued infection.  Do not have sex until treatment is complete or as directed by your health care provider.  Keep all follow-up visits as directed by your health care provider.  Not all test results are available during your visit. If your test results are not back during the visit, make an appointment with your health care provider to find out the results. Do not assume everything is normal if you have not heard from your health care provider or the medical facility. It is your responsibility to get your test results.  If you test positive for gonorrhea, inform your recent sexual partners. They need to be checked for gonorrhea even if they do not have symptoms. They may need treatment, even if they test negative for gonorrhea.  SEEK MEDICAL CARE IF:  You develop any bad reaction to the medicine you were prescribed. This may include:  A rash.  Nausea.  Vomiting.  Diarrhea.  Your symptoms do not improve after a few days of taking antibiotics.  Your symptoms get worse.  You develop  increased pain, such as in the testicles (for males) or in the abdomen (for females).  You have a fever. MAKE SURE YOU:  Understand these instructions.  Will watch your condition.  Will get help right away if you are not doing well or get worse. This information is not intended to replace advice given to you by your health care provider. Make sure you discuss any  questions you have with your health care provider.  Document Released: 10/24/2000 Document Revised: 11/17/2014 Document Reviewed: 05/04/2013  Elsevier Interactive Patient Education [employer]

## 2016-04-23 NOTE — ED Notes (Signed)
Pt reports discharge that is thick and clear but she does not feel "right"  Denies itching or pain

## 2016-04-23 NOTE — ED Notes (Addendum)
States she has vaginal itching and some discharge 2 days ago, white in color, states she has not tried any OTC medication

## 2016-04-23 NOTE — ED Provider Notes (Signed)
[facility] Emergency Department Provider Note  ____________________________________________  Time seen: Approximately 3:03 PM  I have reviewed the triage vital signs and the nursing notes.   HISTORY  Chief Complaint Vaginitis    HPI Anne Cochran is a 25 y.o. female , NAD, presents to the emergency department with 2 day history of vaginal itching and discharge. States she has had a thick, mucoid discharge that is abnormal. States that her vaginal area "does not feel right". Has had 2 new female sexual partners in the last 6 months and does not use protection. Is uncertain if she has been exposed to any STDs at this time. Has had negative STD screen over the last 6 months but is uncertain of when that occurred. Denies any bleeding, abdominal pain, nausea, vomiting, back pain, dysuria, hematuria. Has not had any fevers, chills, body aches.   History reviewed. No pertinent past medical history.  There are no active problems to display for this patient.   Past Surgical History  Procedure Laterality Date  . Cholecystectomy      Current Outpatient Rx  Name  Route  Sig  Dispense  Refill  . naproxen (NAPROSYN) 500 MG tablet   Oral   Take 1 tablet (500 mg total) by mouth 2 (two) times daily with a meal.   14 tablet   0     Allergies Penicillins and Sulfa antibiotics  History reviewed. No pertinent family history.  Social History Social History  Substance Use Topics  . Smoking status: Never Smoker   . Smokeless tobacco: None  . Alcohol Use: No     Review of Systems  Constitutional: No fever/chills Cardiovascular: No chest pain. Respiratory: No shortness of breath.  Gastrointestinal: No abdominal pain.  No nausea, vomiting.  No diarrhea.  Genitourinary: Positive vaginal discharge, itching. Negative for dysuria. No hematuria. No urinary hesitancy, urgency or increased frequency. Musculoskeletal: Negative for back pain.  Skin: Negative for  rash. Neurological: Negative for headaches, focal weakness or numbness. No saddle paresthesias 10-point ROS otherwise negative.  ____________________________________________   PHYSICAL EXAM:  VITAL SIGNS: ED Triage Vitals  Enc Vitals Group     BP 04/23/16 1349 129/93 mmHg     Pulse Rate 04/23/16 1349 100     Resp 04/23/16 1349 18     Temp 04/23/16 1349 98.9 F (37.2 C)     Temp Source 04/23/16 1349 Oral     SpO2 04/23/16 1349 98 %     Weight 04/23/16 1349 214 lb (97.07 kg)     Height 04/23/16 1349 5\' 2"  (1.575 m)     Head Cir --      Peak Flow --      Pain Score 04/23/16 1350 2     Pain Loc --      Pain Edu? --      Excl. in GC? --     Physical exam completed in the presence of Tammy, ED tech  Constitutional: Alert and oriented. Well appearing and in no acute distress. Eyes: Conjunctivae are normal.  Head: Atraumatic. Hematological/Lymphatic/Immunilogical: No inguinal lymphadenopathy. Cardiovascular:  Good peripheral circulation. Respiratory: Normal respiratory effort without tachypnea or retractions.  Gastrointestinal: Soft and nontenderWithout distention or guarding in all quadrants.  Genitourinary: Thick, mucoid white discharge noted throughout the entire vaginal vault and about the cervix. Negative whiff test. Vaginal mucosa is normal without any ulcerations or bleeding. No CMT. Neurologic:  Normal speech and language. No gross focal neurologic deficits are appreciated.  Skin:  Skin  is warm, dry and intact. No rash, skin sores, redness, swelling noted. Psychiatric: Mood and affect are normal. Speech and behavior are normal. Patient exhibits appropriate insight and judgement.   ____________________________________________   LABS (all labs ordered are listed, but only abnormal results are displayed)  Labs Reviewed  WET PREP, GENITAL - Abnormal; Notable for the following:    WBC, Wet Prep HPF POC TOO NUMEROUS TO COUNT (*)    All other components within normal  limits  CHLAMYDIA/NGC RT PCR [facility] ONLY)   ____________________________________________  EKG  None ____________________________________________  RADIOLOGY  None ____________________________________________    PROCEDURES  Procedure(s) performed: None   Medications  cefTRIAXone (ROCEPHIN) injection 250 mg (250 mg Intramuscular Given 04/23/16 1609)  azithromycin (ZITHROMAX) tablet 1,000 mg (1,000 mg Oral Given 04/23/16 1608)   ----------------------------------------- 4:33 PM on 04/23/2016 -----------------------------------------  Patient has tolerated both Rocephin and Zithromax without any side effects or negative outcomes. We will call her with results of her gonorrhea and chlamydia culture when they are available.  ____________________________________________   INITIAL IMPRESSION / ASSESSMENT AND PLAN / ED COURSE  Pertinent lab results that were available during my care of the patient were reviewed by me and considered in my medical decision making (see chart for details).  Patient's diagnosis is consistent with Vaginitis that is consistent with potential chlamydial infection. Patient will be discharged home with instructions to notify her sexual partners that they will need to be seen and treated at the local health department. Patient was given appropriate treatment based on CDC recommendations for gonorrhea and chlamydial infections while in the emergency department. Patient is to follow up with her primary care provider or the [facility] Department if symptoms persist past this treatment course. Patient is given ED precautions to return to the ED for any worsening or new symptoms.    ____________________________________________  FINAL CLINICAL IMPRESSION(S) / ED DIAGNOSES  Final diagnoses:  Vaginitis      NEW MEDICATIONS STARTED DURING THIS VISIT:  New Prescriptions   No medications on file         Hope Pigeon, PA-C 04/23/16  1636  Governor Rooks, MD 04/24/16 1109

## 2016-04-24 ENCOUNTER — Telehealth: Payer: Self-pay | Admitting: Emergency Medicine

## 2016-04-24 NOTE — ED Notes (Signed)
Contacted patient via telephone. Last 4 digits of SS, DOB, and date in ED used for identification. Told of positive Chlamydia results.  Was treated in ED at time of visit. Discussed partner treatment. Patient questioned when she could resume sexual activity - advised 2 weeks post treatment of self and/or partner(s).

## 2017-03-30 DIAGNOSIS — E860 Dehydration: Secondary | ICD-10-CM | POA: Diagnosis not present

## 2017-03-30 DIAGNOSIS — R197 Diarrhea, unspecified: Secondary | ICD-10-CM | POA: Insufficient documentation

## 2017-03-30 DIAGNOSIS — R112 Nausea with vomiting, unspecified: Secondary | ICD-10-CM | POA: Diagnosis present

## 2017-03-31 ENCOUNTER — Encounter: Payer: Self-pay | Admitting: Emergency Medicine

## 2017-03-31 ENCOUNTER — Emergency Department
Admission: EM | Admit: 2017-03-31 | Discharge: 2017-03-31 | Disposition: A | Discharge status: Home / Self Care | Payer: Medicaid Other | Attending: Emergency Medicine | Admitting: Emergency Medicine

## 2017-03-31 DIAGNOSIS — E860 Dehydration: Secondary | ICD-10-CM

## 2017-03-31 DIAGNOSIS — R112 Nausea with vomiting, unspecified: Secondary | ICD-10-CM

## 2017-03-31 DIAGNOSIS — R197 Diarrhea, unspecified: Secondary | ICD-10-CM

## 2017-03-31 LAB — CBC
HEMATOCRIT: 40.3 % (ref 35.0–47.0)
HEMOGLOBIN: 13.9 g/dL (ref 12.0–16.0)
MCH: 30.6 pg (ref 26.0–34.0)
MCHC: 34.4 g/dL (ref 32.0–36.0)
MCV: 89 fL (ref 80.0–100.0)
PLATELETS: 227 10*3/uL (ref 150–440)
RBC: 4.53 MIL/uL (ref 3.80–5.20)
RDW: 14.1 % (ref 11.5–14.5)
WBC: 6.4 10*3/uL (ref 3.6–11.0)

## 2017-03-31 LAB — COMPREHENSIVE METABOLIC PANEL
ALK PHOS: 68 U/L (ref 38–126)
ALT: 37 U/L (ref 14–54)
ANION GAP: 6 (ref 5–15)
AST: 29 U/L (ref 15–41)
Albumin: 4.3 g/dL (ref 3.5–5.0)
BILIRUBIN TOTAL: 0.6 mg/dL (ref 0.3–1.2)
BUN: 17 mg/dL (ref 6–20)
CALCIUM: 8.9 mg/dL (ref 8.9–10.3)
CO2: 28 mmol/L (ref 22–32)
Chloride: 105 mmol/L (ref 101–111)
Creatinine, Ser: 0.64 mg/dL (ref 0.44–1.00)
GFR calc Af Amer: >60 mL/min (ref >60)
GLUCOSE: 122 mg/dL — AB (ref 65–99)
POTASSIUM: 3.3 mmol/L — AB (ref 3.5–5.1)
Sodium: 139 mmol/L (ref 135–145)
TOTAL PROTEIN: 7.9 g/dL (ref 6.5–8.1)

## 2017-03-31 LAB — URINALYSIS, COMPLETE (UACMP) WITH MICROSCOPIC
Bacteria, UA: NONE SEEN
Bilirubin Urine: NEGATIVE
Glucose, UA: NEGATIVE mg/dL
KETONES UR: NEGATIVE mg/dL
Leukocytes, UA: NEGATIVE
Nitrite: NEGATIVE
PH: 5 (ref 5.0–8.0)
PROTEIN: 30 mg/dL — AB
Specific Gravity, Urine: 1.032 — ABNORMAL HIGH (ref 1.005–1.030)

## 2017-03-31 LAB — LIPASE, BLOOD: Lipase: 21 U/L (ref 11–51)

## 2017-03-31 LAB — POCT PREGNANCY, URINE: Preg Test, Ur: NEGATIVE

## 2017-03-31 LAB — TROPONIN I

## 2017-03-31 MED ORDER — GI COCKTAIL ~~LOC~~
30.0000 mL | Freq: Once | ORAL | Status: AC
  Administered 2017-03-31: 30 mL via ORAL
  Filled 2017-03-31: qty 30

## 2017-03-31 MED ORDER — ONDANSETRON 4 MG PO TBDP: 4.0000 mg | ORAL_TABLET | Freq: Three times a day (TID) | ORAL | 0 refills | Status: DC | PRN

## 2017-03-31 MED ORDER — ONDANSETRON HCL 4 MG/2ML IJ SOLN
4.0000 mg | Freq: Once | INTRAMUSCULAR | Status: AC
  Administered 2017-03-31: 4 mg via INTRAVENOUS
  Filled 2017-03-31: qty 2

## 2017-03-31 MED ORDER — SODIUM CHLORIDE 0.9 % IV BOLUS (SEPSIS)
1000.0000 mL | Freq: Once | INTRAVENOUS | Status: AC
  Administered 2017-03-31: 1000 mL via INTRAVENOUS

## 2017-03-31 NOTE — Discharge Instructions (Signed)
Please follow up with your primary care physician.

## 2017-03-31 NOTE — ED Triage Notes (Signed)
Pt presents to ED c/o upper abd pain , emesis, diarrhea with dizziness x3 days

## 2017-03-31 NOTE — ED Provider Notes (Signed)
[facility] Emergency Department Provider Note   ____________________________________________   First MD Initiated Contact with Patient 03/31/17 [phone]     (approximate)  I have reviewed the triage vital signs and the nursing notes.   HISTORY  Chief Complaint Abdominal Pain; Emesis; Dizziness; and Diarrhea    HPI Anne Cochran is a 26 y.o. female who comes into the hospital today with 3 days of nausea and stomach twisting. She reports that she's been unable to hold down foods or liquids and she has had some diarrhea. The patient states that the pain is in her upper abdomen and is currently a 3-4 out of 10 in intensity. The patient has never had these symptoms before. She reports her emesis initially looked like food and then looking like yellowliquid. The patient reports that her stool is also been yellow. She has had some dizziness and lightheadedness as well. The patient denies any sick contacts or fevers. She decided to come into the hospital today for further evaluation of the symptoms.   History reviewed. No pertinent past medical history.  There are no active problems to display for this patient.   Past Surgical History:  Procedure Laterality Date  . CHOLECYSTECTOMY      Prior to Admission medications   Medication Sig Start Date End Date Taking? Authorizing Provider  naproxen (NAPROSYN) 500 MG tablet Take 1 tablet (500 mg total) by mouth 2 (two) times daily with a meal. 03/06/16   Hagler, Jami L, PA-C  ondansetron (ZOFRAN ODT) 4 MG disintegrating tablet Take 1 tablet (4 mg total) by mouth every 8 (eight) hours as needed for nausea or vomiting. 03/31/17   Rebecka Apley, MD    Allergies Penicillins and Sulfa antibiotics  No family history on file.  Social History Social History  Substance Use Topics  . Smoking status: Never Smoker  . Smokeless tobacco: Not on file  . Alcohol use No    Review of Systems  Constitutional: No  fever/chills Eyes: No visual changes. ENT: No sore throat. Cardiovascular: Denies chest pain. Respiratory: Denies shortness of breath. Gastrointestinal: Nausea vomiting and abdominal pain as well as some diarrhea No constipation. Genitourinary: Negative for dysuria. Musculoskeletal: Negative for back pain. Skin: Negative for rash. Neurological: Negative for headaches, focal weakness or numbness.   ____________________________________________   PHYSICAL EXAM:  VITAL SIGNS: ED Triage Vitals  Enc Vitals Group     BP 03/31/17 0003 126/84     Pulse Rate 03/31/17 0003 84     Resp 03/31/17 0003 19     Temp 03/31/17 0003 98.3 F (36.8 C)     Temp Source 03/31/17 0003 Oral     SpO2 03/31/17 0003 99 %     Weight 03/31/17 0004 218 lb (98.9 kg)     Height 03/31/17 0004 5\' 2"  (1.575 m)     Head Circumference --      Peak Flow --      Pain Score 03/31/17 0005 5     Pain Loc --      Pain Edu? --      Excl. in GC? --     Constitutional: Alert and oriented. Well appearing and in Mild distress. Eyes: Conjunctivae are normal. PERRL. EOMI. Head: Atraumatic. Nose: No congestion/rhinnorhea. Mouth/Throat: Mucous membranes are moist.  Oropharynx non-erythematous. Cardiovascular: Normal rate, regular rhythm. Grossly normal heart sounds.  Good peripheral circulation. Respiratory: Normal respiratory effort.  No retractions. Lungs CTAB. Gastrointestinal: Soft with some mild epigastric tenderness to palpation. No  distention. Positive bowel sounds Musculoskeletal: No lower extremity tenderness nor edema.   Neurologic:  Normal speech and language. Skin:  Skin is warm, dry and intact.  Psychiatric: Mood and affect are normal.   ____________________________________________   LABS (all labs ordered are listed, but only abnormal results are displayed)  Labs Reviewed  COMPREHENSIVE METABOLIC PANEL - Abnormal; Notable for the following:       Result Value   Potassium 3.3 (*)    Glucose, Bld 122  (*)    All other components within normal limits  URINALYSIS, COMPLETE (UACMP) WITH MICROSCOPIC - Abnormal; Notable for the following:    Color, Urine YELLOW (*)    APPearance TURBID (*)    Specific Gravity, Urine 1.032 (*)    Hgb urine dipstick SMALL (*)    Protein, ur 30 (*)    Squamous Epithelial / LPF 0-5 (*)    All other components within normal limits  LIPASE, BLOOD  CBC  TROPONIN I  POC URINE PREG, ED  POCT PREGNANCY, URINE   ____________________________________________  EKG  ED ECG REPORT I, Rebecka ApleyWebster,  Brittian Renaldo P, the attending physician, personally viewed and interpreted this ECG.   Date: 03/30/2017  EKG Time: 0016  Rate: 86  Rhythm: normal sinus rhythm  Axis: normal  Intervals:none  ST&T Change: none  ____________________________________________  RADIOLOGY  none ____________________________________________   PROCEDURES  Procedure(s) performed: None  Procedures  Critical Care performed: No  ____________________________________________   INITIAL IMPRESSION / ASSESSMENT AND PLAN / ED COURSE  Pertinent labs & imaging results that were available during my care of the patient were reviewed by me and considered in my medical decision making (see chart for details).  This is a 26 year old female who comes into the hospital today with some nausea vomiting and diarrhea. The patient is also having some abdominal discomfort in her epigastric area. The patient's blood work is unremarkable but we are still waiting for the results of the patient's urinalysis. I did see the patient's urine and it does appear dark. I will give the patient a liter of normal saline as well as a GI cocktail and some Zofran. I will reassess the patient once I received the results of her urinalysis.     The patient's blood work is unremarkable. She did receive a liter of normal saline. The patient was able to take po in the ED. She will be discharged home.    ____________________________________________   FINAL CLINICAL IMPRESSION(S) / ED DIAGNOSES  Final diagnoses:  Nausea vomiting and diarrhea  Dehydration      NEW MEDICATIONS STARTED DURING THIS VISIT:  New Prescriptions   ONDANSETRON (ZOFRAN ODT) 4 MG DISINTEGRATING TABLET    Take 1 tablet (4 mg total) by mouth every 8 (eight) hours as needed for nausea or vomiting.     Note:  This document was prepared using Dragon voice recognition software and may include unintentional dictation errors.    Rebecka ApleyWebster, Cuinn Westerhold P, MD 03/31/17 [phone]

## 2017-11-14 ENCOUNTER — Emergency Department
Admission: EM | Admit: 2017-11-14 | Discharge: 2017-11-14 | Disposition: A | Discharge status: Home / Self Care | Payer: Medicaid Other | Attending: Emergency Medicine | Admitting: Emergency Medicine

## 2017-11-14 ENCOUNTER — Other Ambulatory Visit: Payer: Self-pay

## 2017-11-14 DIAGNOSIS — M545 Low back pain: Secondary | ICD-10-CM | POA: Diagnosis present

## 2017-11-14 DIAGNOSIS — Z5321 Procedure and treatment not carried out due to patient leaving prior to being seen by health care provider: Secondary | ICD-10-CM | POA: Insufficient documentation

## 2017-11-14 LAB — POCT PREGNANCY, URINE: PREG TEST UR: NEGATIVE

## 2017-11-14 NOTE — ED Triage Notes (Signed)
Patient reports lower back pain denies any type of injury for the past month.

## 2017-11-26 ENCOUNTER — Other Ambulatory Visit: Payer: Self-pay

## 2017-11-26 ENCOUNTER — Encounter: Payer: Self-pay | Admitting: Emergency Medicine

## 2017-11-26 ENCOUNTER — Emergency Department
Admission: EM | Admit: 2017-11-26 | Discharge: 2017-11-26 | Disposition: A | Discharge status: Home / Self Care | Payer: Medicaid Other | Attending: Emergency Medicine | Admitting: Emergency Medicine

## 2017-11-26 DIAGNOSIS — Z202 Contact with and (suspected) exposure to infections with a predominantly sexual mode of transmission: Secondary | ICD-10-CM | POA: Insufficient documentation

## 2017-11-26 DIAGNOSIS — F17200 Nicotine dependence, unspecified, uncomplicated: Secondary | ICD-10-CM | POA: Diagnosis not present

## 2017-11-26 DIAGNOSIS — Z113 Encounter for screening for infections with a predominantly sexual mode of transmission: Secondary | ICD-10-CM | POA: Diagnosis present

## 2017-11-26 HISTORY — DX: Other specified bacterial agents as the cause of diseases classified elsewhere: B96.89

## 2017-11-26 HISTORY — DX: Other specified bacterial agents as the cause of diseases classified elsewhere: N76.0

## 2017-11-26 MED ORDER — CEFTRIAXONE SODIUM 250 MG IJ SOLR
250.0000 mg | Freq: Once | INTRAMUSCULAR | Status: AC
Start: 2017-11-26 — End: 2017-11-26
  Administered 2017-11-26: 250 mg via INTRAMUSCULAR
  Filled 2017-11-26: qty 250

## 2017-11-26 MED ORDER — AZITHROMYCIN 500 MG PO TABS
1000.0000 mg | ORAL_TABLET | Freq: Once | ORAL | Status: AC
Start: 2017-11-26 — End: 2017-11-26
  Administered 2017-11-26: 1000 mg via ORAL
  Filled 2017-11-26: qty 2

## 2017-11-26 NOTE — ED Triage Notes (Signed)
Patient ambulatory to triage with steady gait, without difficulty or distress noted; pt reports her boyfriend informed her that he had chlamydia; denies any new vag discharge or urinary c/o; st has hx BV

## 2017-11-26 NOTE — ED Notes (Signed)
Pt discharged to home.  Family member driving.  Discharge instructions reviewed.  Verbalized understanding.  No questions or concerns at this time.  Teach back verified.  Pt in NAD.  No items left in ED.   

## 2017-11-26 NOTE — ED Provider Notes (Signed)
[facility]. [facility] Emergency Department Provider Note  ____________________________________________  Time seen: Approximately 8:36 PM  I have reviewed the triage vital signs and the nursing notes.   HISTORY  Chief Complaint SEXUALLY TRANSMITTED DISEASE    HPI Anne Cochran is a 27 y.o. female presents to the emergency department for empiric treatment for gonorrhea and chlamydia.  Patient reports that her boyfriend was diagnosed with chlamydia this week.  Patient reports that she made an appointment with local health department for a full STD panel.  Patient reports that she would like empiric treatment for gonorrhea and chlamydia and is not seeking testing.  Patient reports no dysuria, increased vaginal discharge, dyspareunia or flank pain.  Patient has been afebrile.  No alleviating measures have been attempted.   Past Medical History:  Diagnosis Date  . Bacterial vaginosis     There are no active problems to display for this patient.   Past Surgical History:  Procedure Laterality Date  . CHOLECYSTECTOMY      Prior to Admission medications   Medication Sig Start Date End Date Taking? Authorizing Provider  naproxen (NAPROSYN) 500 MG tablet Take 1 tablet (500 mg total) by mouth 2 (two) times daily with a meal. 03/06/16   Hagler, Jami L, PA-C  ondansetron (ZOFRAN ODT) 4 MG disintegrating tablet Take 1 tablet (4 mg total) by mouth every 8 (eight) hours as needed for nausea or vomiting. 03/31/17   Rebecka ApleyWebster, Allison P, MD    Allergies Penicillins and Sulfa antibiotics  No family history on file.  Social History Social History   Tobacco Use  . Smoking status: Current Some Day Smoker  . Smokeless tobacco: Never Used  Substance Use Topics  . Alcohol use: No  . Drug use: Not on file     Review of Systems  Constitutional: No fever/chills Eyes: No visual changes. No discharge ENT: No upper respiratory complaints. Cardiovascular: no chest  pain. Respiratory: no cough. No SOB. Gastrointestinal: No abdominal pain.  No nausea, no vomiting.  No diarrhea.  No constipation. Genitourinary: Negative for dysuria. No hematuria Musculoskeletal: Negative for musculoskeletal pain. Skin: Negative for rash, abrasions, lacerations, ecchymosis. Neurological: Negative for headaches, focal weakness or numbness.   ____________________________________________   PHYSICAL EXAM:  VITAL SIGNS: ED Triage Vitals  Enc Vitals Group     BP 11/26/17 1906 (!) 146/69     Pulse Rate 11/26/17 1906 90     Resp 11/26/17 1906 20     Temp 11/26/17 1906 97.9 F (36.6 C)     Temp Source 11/26/17 1906 Oral     SpO2 11/26/17 1906 97 %     Weight 11/26/17 1903 225 lb (102.1 kg)     Height 11/26/17 1903 5\' 2"  (1.575 m)     Head Circumference --      Peak Flow --      Pain Score --      Pain Loc --      Pain Edu? --      Excl. in GC? --      Constitutional: Alert and oriented. Well appearing and in no acute distress. Eyes: Conjunctivae are normal. PERRL. EOMI. Head: Atraumatic. Cardiovascular: Normal rate, regular rhythm. Normal S1 and S2.  Good peripheral circulation. Respiratory: Normal respiratory effort without tachypnea or retractions. Lungs CTAB. Good air entry to the bases with no decreased or absent breath sounds. Gastrointestinal: Bowel sounds 4 quadrants. Soft and nontender to palpation. No guarding or rigidity. No palpable masses. No distention. No CVA  tenderness. Musculoskeletal: Full range of motion to all extremities. No gross deformities appreciated. Neurologic:  Normal speech and language. No gross focal neurologic deficits are appreciated.  Skin:  Skin is warm, dry and intact. No rash noted.  ____________________________________________   LABS (all labs ordered are listed, but only abnormal results are displayed)  Labs Reviewed - No data to  display ____________________________________________  EKG   ____________________________________________  RADIOLOGY   No results found.  ____________________________________________    PROCEDURES  Procedure(s) performed:    Procedures    Medications  azithromycin (ZITHROMAX) tablet 1,000 mg (not administered)  cefTRIAXone (ROCEPHIN) injection 250 mg (not administered)     ____________________________________________   INITIAL IMPRESSION / ASSESSMENT AND PLAN / ED COURSE  Pertinent labs & imaging results that were available during my care of the patient were reviewed by me and considered in my medical decision making (see chart for details).  Review of the [place] CSRS was performed in accordance of the [facility] prior to dispensing any controlled drugs.     Assessment and plan STD exposure Patient presents to the emergency department after STD exposure.  Patient presents to the emergency department tonight for empiric treatment for gonorrhea and chlamydia.  Patient assured me that she has an appointment with local health department for a full STD panel.  Patient was given azithromycin and Rocephin in the emergency department.  Vital signs are reassuring prior to discharge.  All patient questions were answered.    ____________________________________________  FINAL CLINICAL IMPRESSION(S) / ED DIAGNOSES  Final diagnoses:  STD exposure      NEW MEDICATIONS STARTED DURING THIS VISIT:  ED Discharge Orders    None          This chart was dictated using voice recognition software/Dragon. Despite best efforts to proofread, errors can occur which can change the meaning. Any change was purely unintentional.    Orvil Feil, PA-C 11/26/17 2041    Jeanmarie Plant, MD 11/26/17 [phone]

## 2018-05-26 ENCOUNTER — Other Ambulatory Visit: Payer: Self-pay

## 2018-05-26 ENCOUNTER — Encounter: Payer: Self-pay | Admitting: *Deleted

## 2018-05-26 DIAGNOSIS — L03115 Cellulitis of right lower limb: Secondary | ICD-10-CM | POA: Diagnosis not present

## 2018-05-26 DIAGNOSIS — R2241 Localized swelling, mass and lump, right lower limb: Secondary | ICD-10-CM | POA: Diagnosis present

## 2018-05-26 DIAGNOSIS — T63691A Toxic effect of contact with other venomous marine animals, accidental (unintentional), initial encounter: Secondary | ICD-10-CM | POA: Insufficient documentation

## 2018-05-26 DIAGNOSIS — F17200 Nicotine dependence, unspecified, uncomplicated: Secondary | ICD-10-CM | POA: Diagnosis not present

## 2018-05-26 NOTE — ED Triage Notes (Signed)
Pt reports a sting ray stabbed her right ankle 05/13/18  Pt saw a pmd, no meds   Now area is red and painful for 4 days.

## 2018-05-27 ENCOUNTER — Emergency Department
Admission: EM | Admit: 2018-05-27 | Discharge: 2018-05-27 | Disposition: A | Discharge status: Home / Self Care | Payer: Medicaid Other | Attending: Emergency Medicine | Admitting: Emergency Medicine

## 2018-05-27 DIAGNOSIS — W5689XA Other contact with other nonvenomous marine animals, initial encounter: Secondary | ICD-10-CM

## 2018-05-27 DIAGNOSIS — L03115 Cellulitis of right lower limb: Secondary | ICD-10-CM

## 2018-05-27 DIAGNOSIS — T63691A Toxic effect of contact with other venomous marine animals, accidental (unintentional), initial encounter: Secondary | ICD-10-CM

## 2018-05-27 MED ORDER — DOXYCYCLINE HYCLATE 100 MG PO TABS
ORAL_TABLET | ORAL | Status: AC
  Filled 2018-05-27: qty 1

## 2018-05-27 MED ORDER — DOXYCYCLINE HYCLATE 50 MG PO CAPS: 100.0000 mg | ORAL_CAPSULE | Freq: Two times a day (BID) | ORAL | 0 refills | Status: DC

## 2018-05-27 MED ORDER — DOXYCYCLINE HYCLATE 100 MG PO TABS
100.0000 mg | ORAL_TABLET | Freq: Once | ORAL | Status: AC
  Administered 2018-05-27: 100 mg via ORAL

## 2018-05-27 NOTE — ED Provider Notes (Signed)
[facility] Emergency Department Provider Note   ____________________________________________   First MD Initiated Contact with Patient 05/27/18 0308     (approximate)  I have reviewed the triage vital signs and the nursing notes.   HISTORY  Chief Complaint Stingray injury, red and painful   HPI Anne Cochran is a 27 y.o. female who presents to the ED from home with a chief complaint of red and painful right ankle.  Patient [place] sting[place]  Saw her PCP afterwards; no medications prescribed.  Complains of a 4-day history of red, warm, swollen and painful area surrounding the site of the sting.  Denies associated fever, chills, chest pain, shortness of breath, abdominal pain, nausea or vomiting.   Past Medical History:  Diagnosis Date  . Bacterial vaginosis     There are no active problems to display for this patient.   Past Surgical History:  Procedure Laterality Date  . CHOLECYSTECTOMY      Prior to Admission medications   Medication Sig Start Date End Date Taking? Authorizing Provider  doxycycline (VIBRAMYCIN) 50 MG capsule Take 2 capsules (100 mg total) by mouth 2 (two) times daily. 05/27/18   Irean HongSung, Estephania Licciardi J, MD  naproxen (NAPROSYN) 500 MG tablet Take 1 tablet (500 mg total) by mouth 2 (two) times daily with a meal. 03/06/16   Hagler, Jami L, PA-C  ondansetron (ZOFRAN ODT) 4 MG disintegrating tablet Take 1 tablet (4 mg total) by mouth every 8 (eight) hours as needed for nausea or vomiting. 03/31/17   Rebecka ApleyWebster, Allison P, MD    Allergies Penicillins and Sulfa antibiotics  No family history on file.  Social History Social History   Tobacco Use  . Smoking status: Current Some Day Smoker  . Smokeless tobacco: Never Used  Substance Use Topics  . Alcohol use: No  . Drug use: Not on file    Review of Systems  Constitutional: No fever/chills Eyes: No visual changes. ENT: No sore  throat. Cardiovascular: Denies chest pain. Respiratory: Denies shortness of breath. Gastrointestinal: No abdominal pain.  No nausea, no vomiting.  No diarrhea.  No constipation. Genitourinary: Negative for dysuria. Musculoskeletal: Positive for red, warm and swollen sting site.  Negative for back pain. Skin: Negative for rash. Neurological: Negative for headaches, focal weakness or numbness.   ____________________________________________   PHYSICAL EXAM:  VITAL SIGNS: ED Triage Vitals  Enc Vitals Group     BP 05/26/18 2245 (!) 138/100     Pulse Rate 05/26/18 2245 (!) 114     Resp 05/26/18 2245 20     Temp 05/26/18 2245 98.8 F (37.1 C)     Temp Source 05/26/18 2245 Oral     SpO2 05/26/18 2245 99 %     Weight 05/26/18 2246 216 lb (98 kg)     Height 05/26/18 2246 5\' 2"  (1.575 m)     Head Circumference --      Peak Flow --      Pain Score 05/26/18 2246 6     Pain Loc --      Pain Edu? --      Excl. in GC? --     Constitutional: Alert and oriented. Well appearing and in no acute distress. Eyes: Conjunctivae are normal. PERRL. EOMI. Head: Atraumatic. Nose: No congestion/rhinnorhea. Mouth/Throat: Mucous membranes are moist.  Oropharynx non-erythematous. Neck: No stridor.   Cardiovascular: Normal rate, regular rhythm. Grossly normal heart sounds.  Good peripheral circulation. Respiratory: Normal respiratory  effort.  No retractions. Lungs CTAB. Gastrointestinal: Soft and nontender. No distention. No abdominal bruits. No CVA tenderness. Musculoskeletal:  RLE: Site of sting near right malleolus.  Central scab with small surrounding area of warmth, swelling and erythema.  2+ distal pulses.  Brisk, less than 5-second capillary refill.  No joint effusions.  Full range of motion right ankle without pain. Neurologic:  Normal speech and language. No gross focal neurologic deficits are appreciated.  Skin:  Skin is warm, dry and intact. No rash noted. Psychiatric: Mood and affect are  normal. Speech and behavior are normal.  ____________________________________________   LABS (all labs ordered are listed, but only abnormal results are displayed)  Labs Reviewed - No data to display ____________________________________________  EKG  None ____________________________________________  RADIOLOGY  ED MD interpretation: None  Official radiology report(s): No results found.  ____________________________________________   PROCEDURES  Procedure(s) performed: None  Procedures  Critical Care performed: No  ____________________________________________   INITIAL IMPRESSION / ASSESSMENT AND PLAN / ED COURSE  As part of my medical decision making, I reviewed the following data within the electronic MEDICAL RECORD NUMBER Nursing notes reviewed and incorporated, Old chart reviewed and Notes from prior ED visits   27 year old female who presents 2 weeks after stingray injury with developing cellulitis.  Will treat with doxycycline and patient will follow-up closely with her PCP.  Strict return precautions given.  Patient verbalizes understanding and agrees with plan of care.      ____________________________________________   FINAL CLINICAL IMPRESSION(S) / ED DIAGNOSES  Final diagnoses:  Cellulitis of right lower extremity  Contact with stingray as cause of accidental injury     ED Discharge Orders        Ordered    doxycycline (VIBRAMYCIN) 50 MG capsule  2 times daily     05/27/18 0314       Note:  This document was prepared using Dragon voice recognition software and may include unintentional dictation errors.    Irean Hong, MD 05/27/18 [phone]

## 2018-05-27 NOTE — Discharge Instructions (Addendum)
1.  Start antibiotic as prescribed (doxycycline 100 mg twice daily x7 days). 2.  Return to the ER for worsening symptoms, persistent vomiting, fever, difficulty breathing or other concerns.

## 2018-08-03 LAB — HM PAP SMEAR: HM Pap smear: NEGATIVE

## 2018-08-05 LAB — HM HIV SCREENING LAB: HM HIV Screening: NEGATIVE

## 2019-04-29 ENCOUNTER — Telehealth: Payer: Self-pay | Admitting: Obstetrics & Gynecology

## 2019-04-29 NOTE — Telephone Encounter (Signed)
[facility] referring for Pregnancy exam with positive test result. Called and left voicemail for patient to call back to be schedule

## 2019-05-01 ENCOUNTER — Emergency Department: Payer: Medicaid Other

## 2019-05-01 ENCOUNTER — Other Ambulatory Visit: Payer: Self-pay

## 2019-05-01 ENCOUNTER — Encounter: Payer: Self-pay | Admitting: Emergency Medicine

## 2019-05-01 ENCOUNTER — Emergency Department
Admission: EM | Admit: 2019-05-01 | Discharge: 2019-05-01 | Disposition: A | Discharge status: Home / Self Care | Payer: Medicaid Other | Attending: Emergency Medicine | Admitting: Emergency Medicine

## 2019-05-01 DIAGNOSIS — Z3A01 Less than 8 weeks gestation of pregnancy: Secondary | ICD-10-CM | POA: Diagnosis not present

## 2019-05-01 DIAGNOSIS — O039 Complete or unspecified spontaneous abortion without complication: Secondary | ICD-10-CM | POA: Diagnosis not present

## 2019-05-01 DIAGNOSIS — O209 Hemorrhage in early pregnancy, unspecified: Secondary | ICD-10-CM | POA: Diagnosis present

## 2019-05-01 DIAGNOSIS — F17200 Nicotine dependence, unspecified, uncomplicated: Secondary | ICD-10-CM | POA: Insufficient documentation

## 2019-05-01 LAB — CBC WITH DIFFERENTIAL/PLATELET
Abs Immature Granulocytes: 0.01 10*3/uL (ref 0.00–0.07)
Basophils Absolute: 0 10*3/uL (ref 0.0–0.1)
Basophils Relative: 0 %
Eosinophils Absolute: 0.1 10*3/uL (ref 0.0–0.5)
Eosinophils Relative: 2 %
HCT: 37.8 % (ref 36.0–46.0)
Hemoglobin: 12.6 g/dL (ref 12.0–15.0)
Immature Granulocytes: 0 %
Lymphocytes Relative: 30 %
Lymphs Abs: 2.5 10*3/uL (ref 0.7–4.0)
MCH: 30.6 pg (ref 26.0–34.0)
MCHC: 33.3 g/dL (ref 30.0–36.0)
MCV: 91.7 fL (ref 80.0–100.0)
Monocytes Absolute: 0.5 10*3/uL (ref 0.1–1.0)
Monocytes Relative: 6 %
Neutro Abs: 5.2 10*3/uL (ref 1.7–7.7)
Neutrophils Relative %: 62 %
Platelets: 235 10*3/uL (ref 150–400)
RBC: 4.12 MIL/uL (ref 3.87–5.11)
RDW: 12.9 % (ref 11.5–15.5)
WBC: 8.2 10*3/uL (ref 4.0–10.5)
nRBC: 0 % (ref 0.0–0.2)

## 2019-05-01 LAB — HCG, QUANTITATIVE, PREGNANCY: hCG, Beta Chain, Quant, S: 44619 m[IU]/mL — ABNORMAL HIGH (ref <5)

## 2019-05-01 LAB — ABO/RH: ABO/RH(D): A POS

## 2019-05-01 IMAGING — US OBSTETRIC <14 WK US AND TRANSVAGINAL OB US
1 series · 13 of 28 positions shown · non-contrast
Comparison: None.

CLINICAL DATA: 27-year-old pregnant female presents with vaginal
bleeding. Quantitative beta HCG [DATE].

EDC by LMP: [DATE], projecting to an expected gestational age of
11 weeks 0 days.
EXAM:
OBSTETRIC <14 WK US AND TRANSVAGINAL OB US
TECHNIQUE: Both transabdominal and transvaginal ultrasound examinations were
performed for complete evaluation of the gestation as well as the
maternal uterus, adnexal regions, and pelvic cul-de-sac.
Transvaginal technique was performed to assess early pregnancy.

[Series 1: obstetric <14 wk us and transvaginal ob us · 13 of 106 slices shown]
[im 4/106]
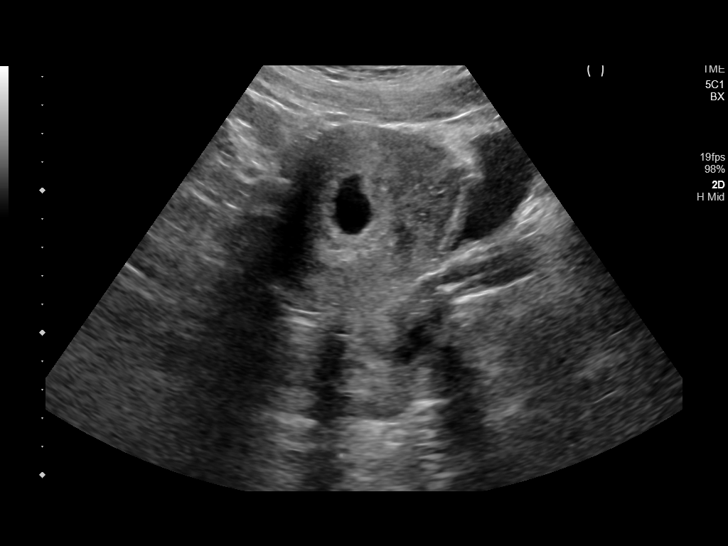
[im 12/106]
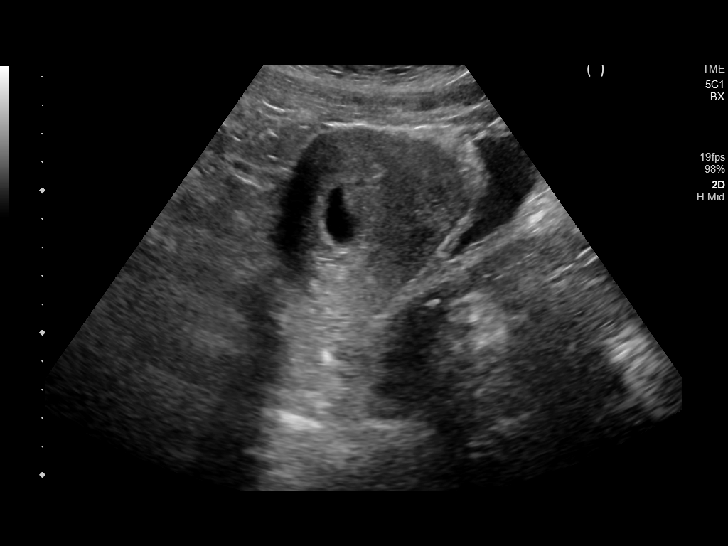
[im 20/106]
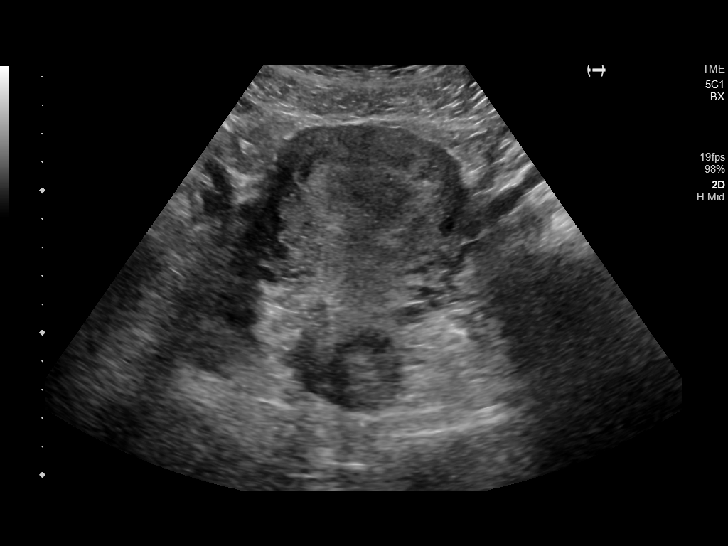
[im 28/106]
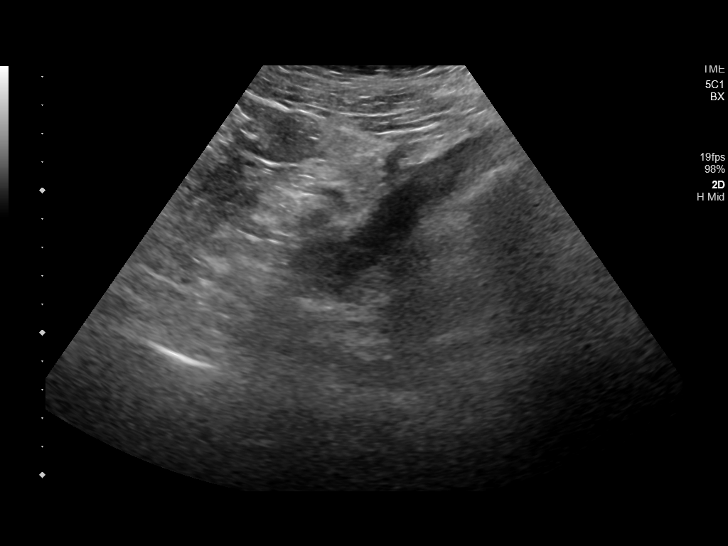
[im 36/106]
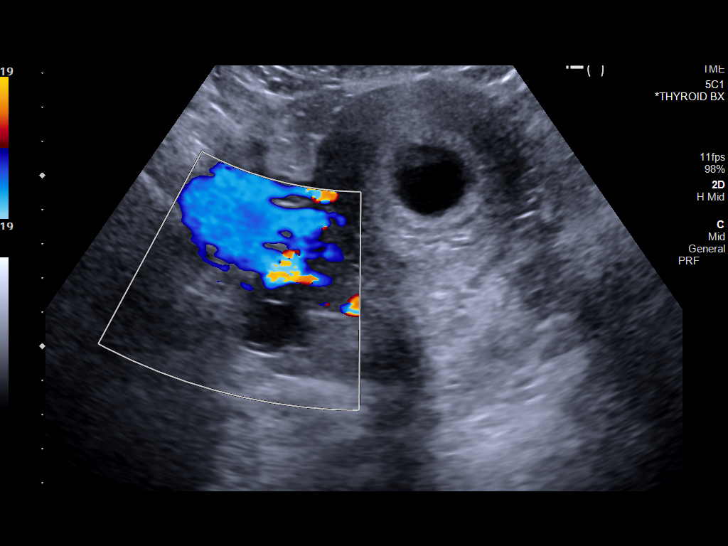
[im 43/106]
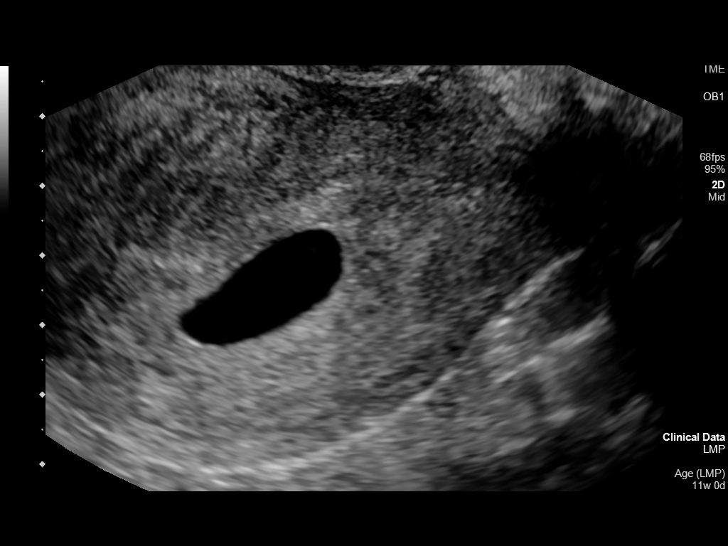
[im 55/106]
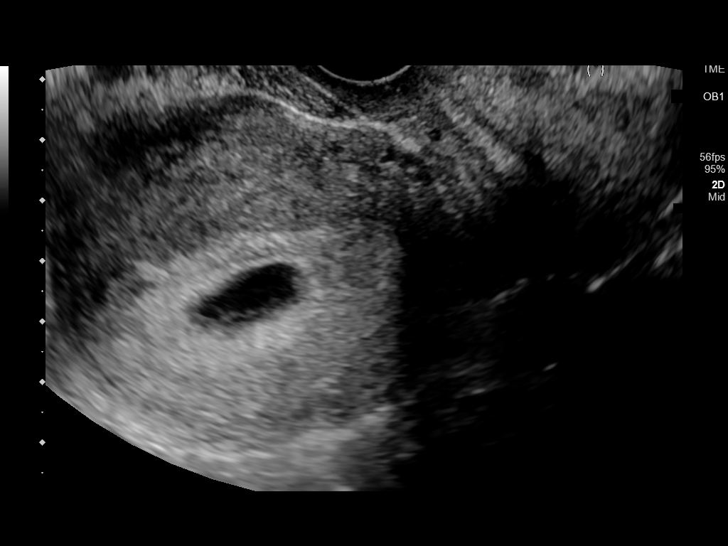
[im 63/106]
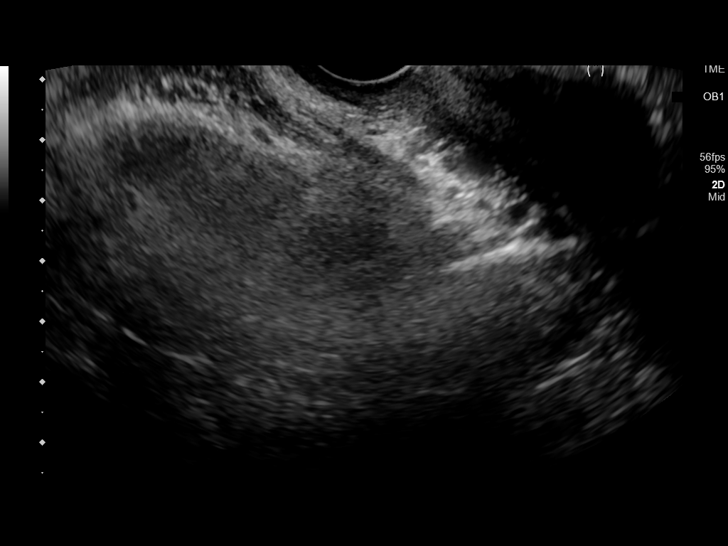
[im 71/106]
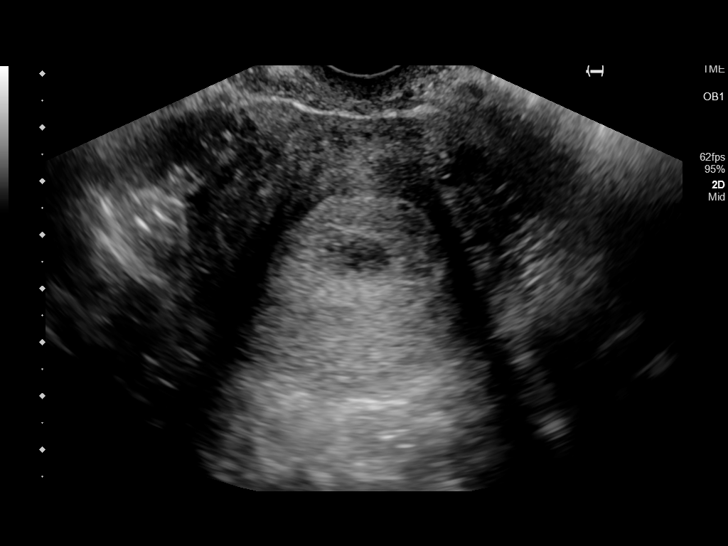
[im 78/106]
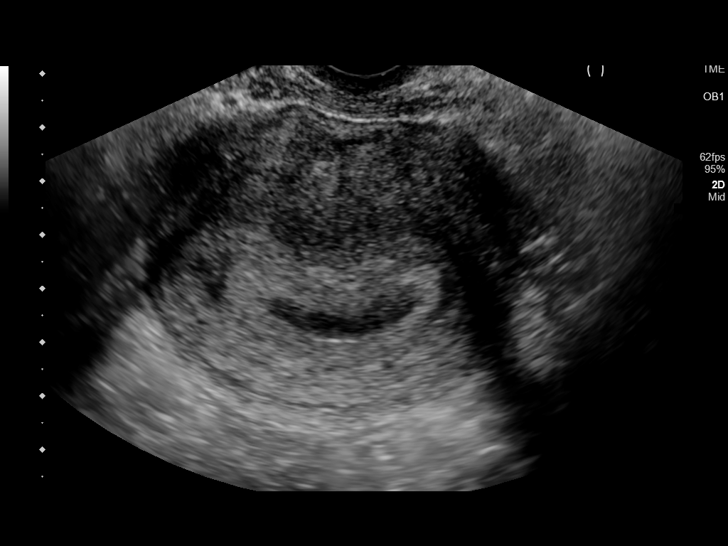
[im 86/106]
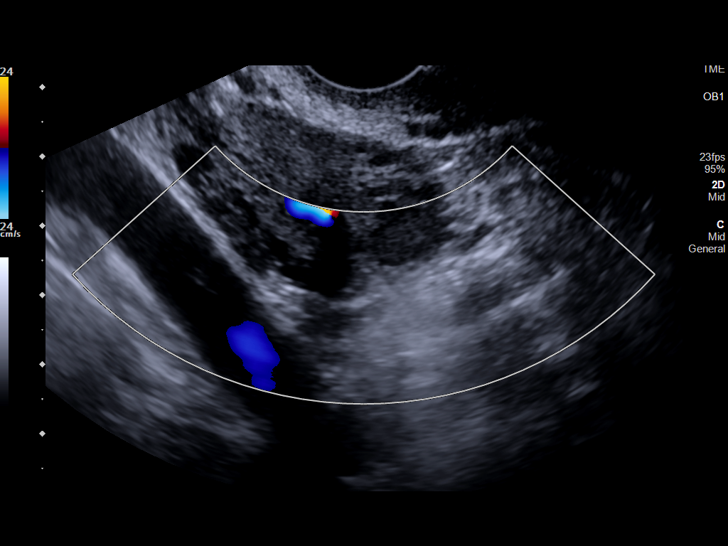
[im 94/106]
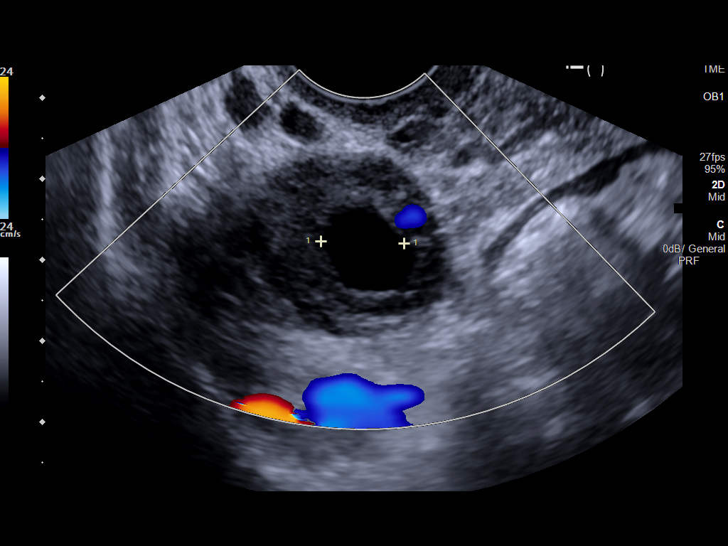
[im 102/106]
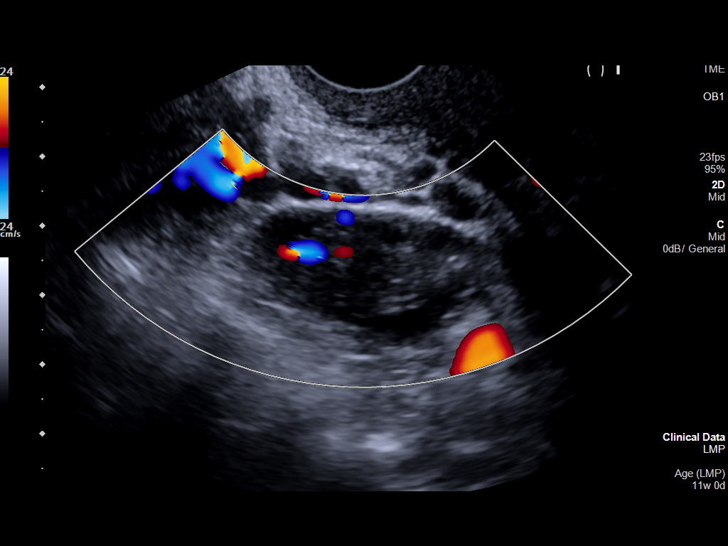

[13 of 28 positions shown; findings below may reference images not displayed]

FINDINGS: Intrauterine gestational sac: Single

Yolk sac:  Candidate irregular small yolk sac.

Embryo:  Not Visualized.

Cardiac Activity: Not Visualized.

MSD: 22.9 mm   7 w   2 d

Subchorionic hemorrhage: Small perigestational bleed in the right
upper cavity measuring 0.8 x 0.7 x 0.8 cm.

Maternal uterus/adnexae: Right ovary measures 4.5 x 2.5 x 2.5 cm and
contains a corpus luteum. Left ovary measures 3.3 x 1.9 x 1.9 cm. No
abnormal ovarian or adnexal masses. No abnormal free fluid in the
pelvis.
IMPRESSION: 1. Single intrauterine gestational sac measuring 7 weeks 2 days by
mean sac diameter, containing a small irregular yolk sac and with no
embryo detected. Small perigestational bleed. Findings are
suspicious but not yet definitive for failed pregnancy. Recommend
follow-up US in 11-14 days for definitive diagnosis. This
recommendation follows SRU consensus guidelines: Diagnostic Criteria
for Nonviable Pregnancy Early in the First Trimester. N Engl J Med
2. No ovarian or adnexal abnormality.

## 2019-05-01 NOTE — ED Notes (Signed)
Assessment: pt states she thinks she is [redacted] weeks pregnant. Pt states she began vaginally bleeding this am around 0200. Pt states she got up to use restroom and saw blood on tissue after wiping. Pt is very tearful. Warm blankets, call bell and kleenex provided. md in to assess pt at this time.

## 2019-05-01 NOTE — Discharge Instructions (Signed)
As explained to you, per your ultrasound seems like this is a failed pregnancy.  You should expect a heavier menstrual period as you have passed the products of conception.  Make sure to follow-up at [facility] in 2 weeks for repeat ultrasound to make sure that you passed at all.  Return to the emergency room if you feel dizzy, if you have severe bleeding, or abdominal pain.

## 2019-05-01 NOTE — ED Triage Notes (Signed)
Patient reports being [redacted] weeks pregnant and woke to go to bathroom and noticed blood in her underwear and when she wiped.

## 2019-05-01 NOTE — ED Provider Notes (Signed)
[facility] Emergency Department Provider Note  ____________________________________________  Time seen: Approximately 5:48 AM  I have reviewed the triage vital signs and the nursing notes.   HISTORY  Chief Complaint Vaginal Bleeding   HPI Anne Cochran is a 28 y.o. female presents for evaluation of vaginal bleeding.  Last menstrual period was in the end of April.  Patient had a positive pregnancy test at home.  This evening, patient noticed significant amount of bleeding into the toilet.  She denies any abdominal pain.  She has not established care for this pregnancy yet.  This is patient's fourth pregnancy.  She has 2 live births and 1 miscarriage.  She is not on blood thinners.  No dizziness.  She denies any history of bleeding disorder.  Past Medical History:  Diagnosis Date   Bacterial vaginosis     There are no active problems to display for this patient.   Past Surgical History:  Procedure Laterality Date   CHOLECYSTECTOMY      Prior to Admission medications   Medication Sig Start Date End Date Taking? Authorizing Provider  doxycycline (VIBRAMYCIN) 50 MG capsule Take 2 capsules (100 mg total) by mouth 2 (two) times daily. 05/27/18   Paulette Blanch, MD  naproxen (NAPROSYN) 500 MG tablet Take 1 tablet (500 mg total) by mouth 2 (two) times daily with a meal. 03/06/16   Hagler, Jami L, PA-C  ondansetron (ZOFRAN ODT) 4 MG disintegrating tablet Take 1 tablet (4 mg total) by mouth every 8 (eight) hours as needed for nausea or vomiting. 03/31/17   Loney Hering, MD    Allergies Penicillins and Sulfa antibiotics  No family history on file.  Social History Social History   Tobacco Use   Smoking status: Current Some Day Smoker   Smokeless tobacco: Never Used  Substance Use Topics   Alcohol use: No   Drug use: Not on file    Review of Systems  Constitutional: Negative for fever. Eyes: Negative for visual changes. ENT: Negative for  sore throat. Neck: No neck pain  Cardiovascular: Negative for chest pain. Respiratory: Negative for shortness of breath. Gastrointestinal: Negative for abdominal pain, vomiting or diarrhea. Genitourinary: Negative for dysuria. + vaginal bleeding Musculoskeletal: Negative for back pain. Skin: Negative for rash. Neurological: Negative for headaches, weakness or numbness. Psych: No SI or HI  ____________________________________________   PHYSICAL EXAM:  VITAL SIGNS: Vitals:   05/01/19 0552  BP: 125/84  Pulse: 75  Resp: 18  Temp: 98.1 F (36.7 C)  SpO2: 100%    Constitutional: Alert and oriented. Well appearing and in no apparent distress. HEENT:      Head: Normocephalic and atraumatic.         Eyes: Conjunctivae are normal. Sclera is non-icteric.       Mouth/Throat: Mucous membranes are moist.       Neck: Supple with no signs of meningismus. Cardiovascular: Regular rate and rhythm. No murmurs, gallops, or rubs. Respiratory: Normal respiratory effort. Lungs are clear to auscultation bilaterally. No wheezes, crackles, or rhonchi.  Gastrointestinal: Soft, non tender, and non distended with positive bowel sounds. No rebound or guarding. Musculoskeletal: Nontender with normal range of motion in all extremities. No edema, cyanosis, or erythema of extremities. Neurologic: Normal speech and language. Face is symmetric. Moving all extremities. No gross focal neurologic deficits are appreciated. Skin: Skin is warm, dry and intact. No rash noted. Psychiatric: Mood and affect are normal. Speech and behavior are normal.  ____________________________________________   LABS (  all labs ordered are listed, but only abnormal results are displayed)  Labs Reviewed  HCG, QUANTITATIVE, PREGNANCY - Abnormal; Notable for the following components:      Result Value   hCG, Beta Chain, Quant, [place] [id number] (*)    All other components within normal limits  CBC WITH DIFFERENTIAL/PLATELET  ABO/RH    ____________________________________________  EKG  none  ____________________________________________  RADIOLOGY  I have personally reviewed the images performed during this visit and I agree with the Radiologist's read.   Interpretation by Radiologist:  [place] Ob Less Than 14 Weeks With Ob Transvaginal  Result Date: 05/01/2019 CLINICAL DATA:  28 year old pregnant female presents with vaginal bleeding. Quantitative beta HCG 45,619. EDC by LMP: 11/20/2019, projecting to an expected gestational age of [redacted] weeks 0 days. EXAM: OBSTETRIC <14 WK US AND TRANSVAGINAL OB US TECHNIQUE: Both transabdominal and transvaginal ultrasound examinations were performed for complete evaluation of the gestation as well as the maternal uterus, adnexal regions, and pelvic cul-de-sac. Transvaginal technique was performed to assess early pregnancy. COMPARISON:  None. FINDINGS: Intrauterine gestational sac: Single Yolk sac:  Candidate irregular small yolk sac. Embryo:  Not Visualized. Cardiac Activity: Not Visualized. MSD: 22.9 mm   7 w   2 d Subchorionic hemorrhage: Small perigestational bleed in the right upper cavity measuring 0.8 x 0.7 x 0.8 cm. Maternal uterus/adnexae: Right ovary measures 4.5 x 2.5 x 2.5 cm and contains a corpus luteum. Left ovary measures 3.3 x 1.9 x 1.9 cm. No abnormal ovarian or adnexal masses. No abnormal free fluid in the pelvis. IMPRESSION: 1. Single intrauterine gestational sac measuring 7 weeks 2 days by mean sac diameter, containing a small irregular yolk sac and with no embryo detected. Small perigestational bleed. Findings are suspicious but not yet definitive for failed pregnancy. Recommend follow-up US in 11-14 days for definitive diagnosis. This recommendation follows SRU consensus guidelines: Diagnostic Criteria for Nonviable Pregnancy Early in the First Trimester. Malva Limes Engl J Med 2013; 409:8119-14; 369:1443-51. 2. No ovarian or adnexal abnormality. Electronically Signed   By: Delbert PhenixJason A Poff M.D.   On:  05/01/2019 04:53     ____________________________________________   PROCEDURES  Procedure(s) performed: None Procedures Critical Care performed:  None ____________________________________________   INITIAL IMPRESSION / ASSESSMENT AND PLAN / ED COURSE  28 y.o. female presents for evaluation of vaginal bleeding. HD stable.  No abdominal pain or tenderness.  Beta quant of 44K.  Ultrasound consistent with failed pregnancy.  Presentation concerning for miscarriage. Blood type A+ with no indication for Rhogam. Stable hgb. Discussed expectations with patient for a miscarriage, close f/u with ObGYN in 2 weeks for repeat US, and return precautions for any signs of acute blood loss anemia.      As part of my medical decision making, I reviewed the following data within the electronic MEDICAL RECORD NUMBER Nursing notes reviewed and incorporated, Labs reviewed , Old chart reviewed, Radiograph reviewed , Notes from prior ED visits and [place] Controlled Substance Database    Pertinent labs & imaging results that were available during my care of the patient were reviewed by me and considered in my medical decision making (see chart for details).    ____________________________________________   FINAL CLINICAL IMPRESSION(S) / ED DIAGNOSES  Final diagnoses:  Miscarriage      NEW MEDICATIONS STARTED DURING THIS VISIT:  ED Discharge Orders    None       Note:  This document was prepared using Dragon voice recognition software and may include unintentional dictation errors.  Don PerkingVeronese, [place], MD 05/01/19 [id number]

## 2019-05-03 ENCOUNTER — Ambulatory Visit (INDEPENDENT_AMBULATORY_CARE_PROVIDER_SITE_OTHER): Payer: Medicaid Other | Admitting: Certified Nurse Midwife

## 2019-05-03 ENCOUNTER — Encounter: Payer: Self-pay | Admitting: Certified Nurse Midwife

## 2019-05-03 ENCOUNTER — Other Ambulatory Visit: Payer: Self-pay

## 2019-05-03 VITALS — BP 120/70 | HR 72 | Ht 62.0 in | Wt 216.0 lb

## 2019-05-03 DIAGNOSIS — O200 Threatened abortion: Secondary | ICD-10-CM

## 2019-05-03 DIAGNOSIS — E669 Obesity, unspecified: Secondary | ICD-10-CM | POA: Insufficient documentation

## 2019-05-03 NOTE — Progress Notes (Addendum)
Obstetrics & Gynecology Office Visit   Chief Complaint:  Chief Complaint  Patient presents with   Follow-up    bleeding has stopped; nothing has come out; little cramping    History of Present Illness: 28 year old G4 P2012 presents for follow up from ER with concerns for threatened abortion. She reports having irregular menses (every 1-2 months) with her LMP 03/13/2019 and positive pregnancy test on 23 May. By her LMP, she would be 6wk 3days gestation today. She was seen in the ER 05/01/2019 for spotting which began 0200 that morning. An ultrasound revealed a gestational sac measuring 22.409mm c/w 7wk2d pregnancy, within which was a irregular small yolk sac, but no embryo was seen. No adnexal masses were seen. There was a small SCH. [place] Ob Less Than 14 Weeks With Ob Transvaginal  Result Date: 05/01/2019 CLINICAL DATA:  28 year old pregnant female presents with vaginal bleeding. Quantitative beta HCG 45,619. EDC by LMP: 11/20/2019, projecting to an expected gestational age of [redacted] weeks 0 days. EXAM: OBSTETRIC <14 WK US AND TRANSVAGINAL OB US TECHNIQUE: Both transabdominal and transvaginal ultrasound examinations were performed for complete evaluation of the gestation as well as the maternal uterus, adnexal regions, and pelvic cul-de-sac. Transvaginal technique was performed to assess early pregnancy. COMPARISON:  None. FINDINGS: Intrauterine gestational sac: Single Yolk sac:  Candidate irregular small yolk sac. Embryo:  Not Visualized. Cardiac Activity: Not Visualized. MSD: 22.9 mm   7 w   2 d Subchorionic hemorrhage: Small perigestational bleed in the right upper cavity measuring 0.8 x 0.7 x 0.8 cm. Maternal uterus/adnexae: Right ovary measures 4.5 x 2.5 x 2.5 cm and contains a corpus luteum. Left ovary measures 3.3 x 1.9 x 1.9 cm. No abnormal ovarian or adnexal masses. No abnormal free fluid in the pelvis. IMPRESSION: 1. Single intrauterine gestational sac measuring 7 weeks 2 days by mean sac diameter,  containing a small irregular yolk sac and with no embryo detected. Small perigestational bleed. Findings are suspicious but not yet definitive for failed pregnancy. Recommend follow-up US in 11-14 days for definitive diagnosis. This recommendation follows SRU consensus guidelines: Diagnostic Criteria for Nonviable Pregnancy Early in the First Trimester. Malva Limes Engl J Med 2013; 213:0865-78; 369:1443-51. 2. No ovarian or adnexal abnormality. Electronically Signed   By: Delbert PhenixJason A Poff M.D.   On: 05/01/2019 04:53    She had light bleeding only, no cramping and is not currently bleeding. Blood type A positive. H&H on 6/21 was normal. Hx of a SAB in early pregnancy in 2005, requiring a D&C and 2 term vaginal deliveries in 2007 and 2013.   Review of Systems:  Review of Systems  Constitutional: Positive for malaise/fatigue. Negative for chills, fever and weight loss.  HENT: Negative for congestion, sinus pain and sore throat.   Eyes: Negative for blurred vision and pain.  Respiratory: Negative for hemoptysis, shortness of breath and wheezing.   Cardiovascular: Negative for chest pain, palpitations and leg swelling.  Gastrointestinal: Negative for abdominal pain, blood in stool, diarrhea, heartburn, nausea and vomiting.  Genitourinary: Positive for frequency. Negative for dysuria, hematuria and urgency.       Positive for spotting x 2 days  Musculoskeletal: Negative for back pain, joint pain and myalgias.  Skin: Negative for itching and rash.  Neurological: Negative for dizziness, tingling and headaches.  Endo/Heme/Allergies: Negative for environmental allergies and polydipsia. Does not bruise/bleed easily.       Negative for hirsutism   Psychiatric/Behavioral: Negative for depression. The patient is not nervous/anxious and  does not have insomnia.   Breasts: positive for breast tenderness  Past Medical History:  Past Medical History:  Diagnosis Date   Bacterial vaginosis    Obesity (BMI 35.0-39.9 without  comorbidity)     Past Surgical History:  Past Surgical History:  Procedure Laterality Date   CHOLECYSTECTOMY     DILATION AND EVACUATION  2005   incomplete AB    Gynecologic History: Patient's last menstrual period was 03/12/2018 (approximate).  Obstetric History: [id number] OB History  Gravida Para Term Preterm AB Living  4 2 2   1 2   SAB TAB Ectopic Multiple Live Births  1       2    # Outcome Date GA Lbr Len/2nd Weight Sex Delivery Anes PTL Lv  4 Current           3 Term 11/22/11 [redacted]w[redacted]d  6 lb 14 oz (3.118 kg) M Vag-Spont   LIV  2 Term 08/13/06 [redacted]w[redacted]d  7 lb 14 oz (3.572 kg) M Vag-Spont   LIV  1 SAB            Family History:  Family History  Problem Relation Age of Onset   Cancer Mother 43   Diabetes Mother    Hypertension Mother    Thyroid disease Mother    Breast cancer Maternal Grandmother 84   Cancer Maternal Grandmother    Diabetes Maternal Grandmother     Social History:  Social History   Socioeconomic History   Marital status: Married    Spouse name: Not on file   Number of children: 2   Years of education: Not on file   Highest education level: Not on file  Occupational History   Not on file  Social Needs   Financial resource strain: Not on file   Food insecurity    Worry: Not on file    Inability: Not on file   Transportation needs    Medical: Not on file    Non-medical: Not on file  Tobacco Use   Smoking status: Current Some Day Smoker   Smokeless tobacco: Never Used  Substance and Sexual Activity   Alcohol use: Yes   Drug use: Not Currently   Sexual activity: Not Currently    Birth control/protection: None  Lifestyle   Physical activity    Days per week: Not on file    Minutes per session: Not on file   Stress: Not on file  Relationships   Social connections    Talks on phone: Not on file    Gets together: Not on file    Attends religious service: Not on file    Active member of club or organization: Not on  file    Attends meetings of clubs or organizations: Not on file    Relationship status: Not on file   Intimate partner violence    Fear of current or ex partner: Not on file    Emotionally abused: Not on file    Physically abused: Not on file    Forced sexual activity: Not on file  Other Topics Concern   Not on file  Social History Narrative   Not on file    Allergies:  Allergies  Allergen Reactions   Penicillins    Sulfa Antibiotics     Medications: Prior to Admission medications   Not on File    Physical Exam Vitals: BP 120/70    Pulse 72    Ht 5\' 2"  (1.575 m)    Wt 216  lb (98 kg)    LMP 03/12/2018    BMI 39.51 kg/m   General: pleasant Hispanic female in NAD Heart: regular rate Lungs: normal respiratory effort Neuro: awake and alert Psyche: normal mood and affect   Assessment: 28 y.o. [id number] possible nonviable (anembryonic) pregnancy vs early pregnancy given hx of irregular menses  Plan: Discussed possible nonviable pregnancy, but would like to make sure this is just not an early pregnancy where the embryo is not visible yet on ultrasound. Will schedule for another ultrasound next week and follow up with MD in case this is a nonviable pregnancy Blood type: A positive-Rhogam not indicated SAB precautions  Farrel Connersolleen Mia Winthrop, CNM

## 2019-05-10 ENCOUNTER — Other Ambulatory Visit: Payer: Self-pay

## 2019-05-10 ENCOUNTER — Ambulatory Visit (INDEPENDENT_AMBULATORY_CARE_PROVIDER_SITE_OTHER): Payer: Medicaid Other

## 2019-05-10 ENCOUNTER — Ambulatory Visit (INDEPENDENT_AMBULATORY_CARE_PROVIDER_SITE_OTHER): Payer: Medicaid Other | Admitting: Obstetrics and Gynecology

## 2019-05-10 ENCOUNTER — Encounter: Payer: Self-pay | Admitting: Obstetrics and Gynecology

## 2019-05-10 VITALS — BP 120/80 | Ht 62.0 in | Wt 217.0 lb

## 2019-05-10 DIAGNOSIS — O3481 Maternal care for other abnormalities of pelvic organs, first trimester: Secondary | ICD-10-CM

## 2019-05-10 DIAGNOSIS — O200 Threatened abortion: Secondary | ICD-10-CM

## 2019-05-10 DIAGNOSIS — O039 Complete or unspecified spontaneous abortion without complication: Secondary | ICD-10-CM | POA: Diagnosis not present

## 2019-05-10 DIAGNOSIS — N8311 Corpus luteum cyst of right ovary: Secondary | ICD-10-CM | POA: Diagnosis not present

## 2019-05-10 NOTE — Progress Notes (Signed)
Patient ID: Anne Cochran, female   DOB: Apr 22, 1991, 28 y.o.   MRN: [id number]  Reason for Consult: Vaginal Bleeding   Referred by [facility]  Subjective:     HPI:  Anne Cochran is a 28 y.o. female. She reports that she had a large amount of bleeding last night were she passed tissue and clots. She was having intense cramping and pain. She has been feeling better since this morning but was up until late at night.  She denies light headedness. Has had a mild headache. Denies dizziness or fainting.   Past Medical History:  Diagnosis Date  . Bacterial vaginosis   . Obesity (BMI 35.0-39.9 without comorbidity)    Family History  Problem Relation Age of Onset  . Cancer Mother 3  . Diabetes Mother   . Hypertension Mother   . Thyroid disease Mother   . Breast cancer Maternal Grandmother 21  . Cancer Maternal Grandmother   . Diabetes Maternal Grandmother    Past Surgical History:  Procedure Laterality Date  . CHOLECYSTECTOMY    . DILATION AND EVACUATION  2005   incomplete AB    Short Social History:  Social History   Tobacco Use  . Smoking status: Current Some Day Smoker  . Smokeless tobacco: Never Used  Substance Use Topics  . Alcohol use: Yes    Allergies  Allergen Reactions  . Penicillins   . Sulfa Antibiotics     No current outpatient medications on file.   No current facility-administered medications for this visit.     Review of Systems  Constitutional: Negative for chills, fatigue, fever and unexpected weight change.  HENT: Negative for trouble swallowing.  Eyes: Negative for loss of vision.  Respiratory: Negative for cough, shortness of breath and wheezing.  Cardiovascular: Negative for chest pain, leg swelling, palpitations and syncope.  GI: Negative for abdominal pain, blood in stool, diarrhea, nausea and vomiting.  GU: Negative for difficulty urinating, dysuria, frequency and hematuria.  Musculoskeletal: Negative for back pain, leg  pain and joint pain.  Skin: Negative for rash.  Neurological: Negative for dizziness, headaches, light-headedness, numbness and seizures.  Psychiatric: Negative for behavioral problem, confusion, depressed mood and sleep disturbance.        Objective:  Objective   Vitals:   05/10/19 1718  BP: 120/80  Weight: 217 lb (98.4 kg)  Height: 5\' 2"  (1.575 m)   Body mass index is 39.69 kg/m.  Physical Exam Vitals signs and nursing note reviewed.  Constitutional:      Appearance: She is well-developed.  HENT:     Head: Normocephalic and atraumatic.  Eyes:     Pupils: Pupils are equal, round, and reactive to light.  Cardiovascular:     Rate and Rhythm: Normal rate and regular rhythm.  Pulmonary:     Effort: Pulmonary effort is normal. No respiratory distress.  Skin:    General: Skin is warm and dry.  Neurological:     Mental Status: She is alert and oriented to person, place, and time.  Psychiatric:        Behavior: Behavior normal.        Thought Content: Thought content normal.        Judgment: Judgment normal.         Assessment/Plan:    28 yo w/ complete miscarriage Patient is doing well.  Questions answered.  Follow up as needed.   More than 15 minutes were spent face to face with the patient in the  room with more than 50% of the time spent providing counseling and discussing the plan of management.      Adelene Idlerhristanna Schuman MD [facility], [facility] 05/10/2019 6:09 PM

## 2019-05-16 ENCOUNTER — Encounter: Payer: Self-pay | Admitting: Maternal Newborn

## 2019-09-05 ENCOUNTER — Encounter: Payer: Self-pay | Admitting: Emergency Medicine

## 2019-09-05 DIAGNOSIS — R102 Pelvic and perineal pain: Secondary | ICD-10-CM | POA: Diagnosis present

## 2019-09-05 DIAGNOSIS — F17200 Nicotine dependence, unspecified, uncomplicated: Secondary | ICD-10-CM | POA: Diagnosis not present

## 2019-09-05 DIAGNOSIS — A6004 Herpesviral vulvovaginitis: Secondary | ICD-10-CM | POA: Diagnosis not present

## 2019-09-05 DIAGNOSIS — R21 Rash and other nonspecific skin eruption: Secondary | ICD-10-CM | POA: Insufficient documentation

## 2019-09-05 DIAGNOSIS — N72 Inflammatory disease of cervix uteri: Secondary | ICD-10-CM | POA: Insufficient documentation

## 2019-09-05 LAB — CBC
HCT: 39.5 % (ref 36.0–46.0)
Hemoglobin: 13 g/dL (ref 12.0–15.0)
MCH: 30.9 pg (ref 26.0–34.0)
MCHC: 32.9 g/dL (ref 30.0–36.0)
MCV: 93.8 fL (ref 80.0–100.0)
Platelets: 199 10*3/uL (ref 150–400)
RBC: 4.21 MIL/uL (ref 3.87–5.11)
RDW: 12.9 % (ref 11.5–15.5)
WBC: 7.7 10*3/uL (ref 4.0–10.5)
nRBC: 0 % (ref 0.0–0.2)

## 2019-09-05 LAB — COMPREHENSIVE METABOLIC PANEL
ALT: 22 U/L (ref 0–44)
AST: 19 U/L (ref 15–41)
Albumin: 4 g/dL (ref 3.5–5.0)
Alkaline Phosphatase: 78 U/L (ref 38–126)
Anion gap: 10 (ref 5–15)
BUN: 19 mg/dL (ref 6–20)
CO2: 27 mmol/L (ref 22–32)
Calcium: 9.1 mg/dL (ref 8.9–10.3)
Chloride: 102 mmol/L (ref 98–111)
Creatinine, Ser: 0.6 mg/dL (ref 0.44–1.00)
GFR calc Af Amer: >60 mL/min (ref >60)
GFR calc non Af Amer: >60 mL/min (ref >60)
Glucose, Bld: 111 mg/dL — ABNORMAL HIGH (ref 70–99)
Potassium: 3.6 mmol/L (ref 3.5–5.1)
Sodium: 139 mmol/L (ref 135–145)
Total Bilirubin: 0.6 mg/dL (ref 0.3–1.2)
Total Protein: 7.7 g/dL (ref 6.5–8.1)

## 2019-09-05 LAB — POCT PREGNANCY, URINE: Preg Test, Ur: NEGATIVE

## 2019-09-05 LAB — LIPASE, BLOOD: Lipase: 35 U/L (ref 11–51)

## 2019-09-05 NOTE — ED Triage Notes (Signed)
Pt reports she used deodorant on genitals over the weekend and today she has experienced dysuria and a rash on the labia.

## 2019-09-06 ENCOUNTER — Emergency Department
Admission: EM | Admit: 2019-09-06 | Discharge: 2019-09-06 | Disposition: A | Discharge status: Home / Self Care | Payer: Medicaid Other | Attending: Emergency Medicine | Admitting: Emergency Medicine

## 2019-09-06 DIAGNOSIS — A6004 Herpesviral vulvovaginitis: Secondary | ICD-10-CM

## 2019-09-06 DIAGNOSIS — N72 Inflammatory disease of cervix uteri: Secondary | ICD-10-CM

## 2019-09-06 LAB — URINALYSIS, COMPLETE (UACMP) WITH MICROSCOPIC
Bacteria, UA: NONE SEEN
Bilirubin Urine: NEGATIVE
Glucose, UA: NEGATIVE mg/dL
Hgb urine dipstick: NEGATIVE
Ketones, ur: NEGATIVE mg/dL
Nitrite: NEGATIVE
Protein, ur: NEGATIVE mg/dL
Specific Gravity, Urine: 1.021 (ref 1.005–1.030)
pH: 6 (ref 5.0–8.0)

## 2019-09-06 LAB — WET PREP, GENITAL
Clue Cells Wet Prep HPF POC: NONE SEEN
Sperm: NONE SEEN
Trich, Wet Prep: NONE SEEN
Yeast Wet Prep HPF POC: NONE SEEN

## 2019-09-06 MED ORDER — ACYCLOVIR 400 MG PO TABS: 400.0000 mg | ORAL_TABLET | Freq: Three times a day (TID) | ORAL | 0 refills | Status: AC

## 2019-09-06 MED ORDER — ACYCLOVIR 200 MG PO CAPS
400.0000 mg | ORAL_CAPSULE | Freq: Once | ORAL | Status: AC
  Administered 2019-09-06: 400 mg via ORAL
  Filled 2019-09-06: qty 2

## 2019-09-06 MED ORDER — CEFTRIAXONE SODIUM 250 MG IJ SOLR
250.0000 mg | Freq: Once | INTRAMUSCULAR | Status: AC
  Administered 2019-09-06: 05:00:00 250 mg via INTRAMUSCULAR
  Filled 2019-09-06: qty 250

## 2019-09-06 MED ORDER — AZITHROMYCIN 500 MG PO TABS
1000.0000 mg | ORAL_TABLET | ORAL | Status: AC
  Administered 2019-09-06: 1000 mg via ORAL
  Filled 2019-09-06: qty 2

## 2019-09-06 NOTE — ED Provider Notes (Signed)
[facility] Emergency Department Provider Note  ____________________________________________   First MD Initiated Contact with Patient 09/06/19 [phone]     (approximate)  I have reviewed the triage vital signs and the nursing notes.   HISTORY  Chief Complaint Dysuria    HPI Anne Cochran is a 28 y.o. female who presents for evaluation of pain and irritation in her genital area.   She reports that for a few days she has been having some discomfort in that area that she assumed was from her legs rubbing together and sweating.  She is also had increased vaginal discharge for a few days since stopping her period about a week ago.  She tried putting on some deodorant mostly on her inner thighs but perhaps a little bit on the labia and she has had increased rash with multiple lesions on her labia since that time.  She reports that they are very tender to palpation and any amount of touching makes them worse.  She has no pain anywhere else including in the abdomen or pelvis.  She has 1 sexual partner with whom she does not use condoms and it is a long-term and presumably monogamous relationship.  She denies fever/chills, sore throat, chest pain, shortness of breath, cough, nausea, vomiting, and abdominal pain.  The burning when she urinates seems to be because of the tender skin on the outside.  Nothing in particular makes the symptoms better.        Past Medical History:  Diagnosis Date   Bacterial vaginosis    Obesity (BMI 35.0-39.9 without comorbidity)     Patient Active Problem List   Diagnosis Date Noted   Obesity (BMI 35.0-39.9 without comorbidity) 05/03/2019    Past Surgical History:  Procedure Laterality Date   CHOLECYSTECTOMY     DILATION AND EVACUATION  2005   incomplete AB    Prior to Admission medications   Medication Sig Start Date End Date Taking? Authorizing Provider  acyclovir (ZOVIRAX) 400 MG tablet Take 1 tablet (400 mg total) by  mouth 3 (three) times daily for 10 days. 09/06/19 09/16/19  Hinda Kehr, MD    Allergies Penicillins and Sulfa antibiotics  Family History  Problem Relation Age of Onset   Cancer Mother 35   Diabetes Mother    Hypertension Mother    Thyroid disease Mother    Breast cancer Maternal Grandmother 40   Cancer Maternal Grandmother    Diabetes Maternal Grandmother     Social History Social History   Tobacco Use   Smoking status: Current Some Day Smoker   Smokeless tobacco: Never Used  Substance Use Topics   Alcohol use: Yes   Drug use: Not Currently    Review of Systems Constitutional: No fever/chills Eyes: No visual changes. ENT: No sore throat. Cardiovascular: Denies chest pain. Respiratory: Denies shortness of breath. Gastrointestinal: No abdominal pain.  No nausea, no vomiting.  No diarrhea.  No constipation. Genitourinary: Rash with multiple tender lesions on the labia, increased vaginal discharge, no vaginal bleeding. Musculoskeletal: Negative for neck pain.  Negative for back pain. Integumentary: Negative for rash. Neurological: Negative for headaches, focal weakness or numbness.   ____________________________________________   PHYSICAL EXAM:  VITAL SIGNS: ED Triage Vitals [09/05/19 2301]  Enc Vitals Group     BP (!) 143/88     Pulse Rate (!) 101     Resp 18     Temp 98.3 F (36.8 C)     Temp Source Oral  SpO2 99 %     Weight      Height      Head Circumference      Peak Flow      Pain Score      Pain Loc      Pain Edu?      Excl. in GC?     Constitutional: Alert and oriented.  No acute distress. Eyes: Conjunctivae are normal.  Head: Atraumatic. Nose: No congestion/rhinnorhea. Mouth/Throat: Patient is wearing a mask. Neck: No stridor.  No meningeal signs.   Cardiovascular: Normal rate, regular rhythm. Good peripheral circulation. Grossly normal heart sounds. Respiratory: Normal respiratory effort.  No  retractions. Gastrointestinal: Soft and nontender. No distention.  Genitourinary: External exam demonstrates vesicular/ulcerated lesions in multiple stages of development ranging from small erythematous papules to obvious ulcerations on bilateral labia majora and labia minora.  These lesions appear consistent with genital herpes.  There is no evidence of abscess or cellulitis.  Lesions are very tender to light touch.  Speculum exam is notable for a large quantity of thick yellowish-white discharge in the vaginal canal.  Cervix has multiple lesions that are erythematous and friable consistent with acute cervicitis, either bacterial or perhaps also herpetic.  ED chaperone was present throughout the exam. Musculoskeletal: No lower extremity tenderness nor edema. No gross deformities of extremities. Neurologic:  Normal speech and language. No gross focal neurologic deficits are appreciated.  Skin:  Skin is warm, dry and intact. Psychiatric: Mood and affect are normal. Speech and behavior are normal.  ____________________________________________   LABS (all labs ordered are listed, but only abnormal results are displayed)  Labs Reviewed  WET PREP, GENITAL - Abnormal; Notable for the following components:      Result Value   WBC, Wet Prep HPF POC MODERATE (*)    All other components within normal limits  COMPREHENSIVE METABOLIC PANEL - Abnormal; Notable for the following components:   Glucose, Bld 111 (*)    All other components within normal limits  URINALYSIS, COMPLETE (UACMP) WITH MICROSCOPIC - Abnormal; Notable for the following components:   Color, Urine YELLOW (*)    APPearance CLEAR (*)    Leukocytes,Ua SMALL (*)    All other components within normal limits  GC/CHLAMYDIA PROBE AMP  LIPASE, BLOOD  CBC  POC URINE PREG, ED  POCT PREGNANCY, URINE   ____________________________________________  EKG  None - EKG not ordered by ED  physician ____________________________________________  RADIOLOGY Marylou MccoyI, Analayah Brooke, personally viewed and evaluated these images (plain radiographs) as part of my medical decision making, as well as reviewing the written report by the radiologist.  ED MD interpretation: No indication for emergent imaging  Official radiology report(s): No results found.  ____________________________________________   PROCEDURES   Procedure(s) performed (including Critical Care):  Procedures   ____________________________________________   INITIAL IMPRESSION / MDM / ASSESSMENT AND PLAN / ED COURSE  As part of my medical decision making, I reviewed the following data within the electronic MEDICAL RECORD NUMBER Nursing notes reviewed and incorporated, Labs reviewed , Old chart reviewed and Notes from prior ED visits   Differential diagnosis includes, but is not limited to, STD/PID/TOA, contact dermatitis from the deodorant application, labial abscess or cellulitis, Fournier's gangrene.  The patient's exam is consistent with genital herpes.  Additionally, she has extensive cervicitis and excessive vaginal discharge.  I am concerned about the possibility of STD such as gonorrhea or chlamydia.  Because we can no longer do in-house testing of gonorrhea chlamydia I am doing  the send out but I am also treating her empirically.  I had an extensive discussion with her including my usual and customary information and recommendations about herpes.  I will treat empirically with the medications listed below in the ED as well as with prescriptions.  I strongly encouraged her to follow-up with OB/GYN and to engage in pelvic rest for the time being.  She understands and agrees with the plan.      Clinical Course as of Sep 05 612  Tue Sep 06, 2019  0550 Wet prep demonstrates moderate WBCs but no evidence of trichomoniasis, yeast, nor clue cells.  I will hold off on treating with Flagyl and I strongly feel that the  patient most likely is suffering from gonorrhea and/or chlamydia.  I am providing a prescription for azithromycin as described below and I strongly encouraged her to follow-up with OB/GYN.  I gave my usual and customary return precautions.   [CF]    Clinical Course User Index [CF] Loleta Rose, MD     ____________________________________________  FINAL CLINICAL IMPRESSION(S) / ED DIAGNOSES  Final diagnoses:  Herpes simplex vulvovaginitis  Cervicitis     MEDICATIONS GIVEN DURING THIS VISIT:  Medications  cefTRIAXone (ROCEPHIN) injection 250 mg (250 mg Intramuscular Given 09/06/19 0448)  azithromycin (ZITHROMAX) tablet 1,000 mg (1,000 mg Oral Given 09/06/19 0508)  acyclovir (ZOVIRAX) 200 MG capsule 400 mg (400 mg Oral Given 09/06/19 0509)     ED Discharge Orders         Ordered    acyclovir (ZOVIRAX) 400 MG tablet  3 times daily     09/06/19 0415          *Please note:  Tsuruko Murtha was evaluated in Emergency Department on 09/06/2019 for the symptoms described in the history of present illness. She was evaluated in the context of the global COVID-19 pandemic, which necessitated consideration that the patient might be at risk for infection with the SARS-CoV-2 virus that causes COVID-19. Institutional protocols and algorithms that pertain to the evaluation of patients at risk for COVID-19 are in a state of rapid change based on information released by regulatory bodies including the CDC and federal and state organizations. These policies and algorithms were followed during the patient's care in the ED.  Some ED evaluations and interventions may be delayed as a result of limited staffing during the pandemic.*  Note:  This document was prepared using Dragon voice recognition software and may include unintentional dictation errors.   Loleta Rose, MD 09/06/19 [phone]

## 2019-09-06 NOTE — ED Notes (Signed)
This Probation officer in room with EDP during vaginal exam.

## 2019-09-06 NOTE — Discharge Instructions (Addendum)
As we discussed, I believe that your symptoms tonight are the result of an outbreak of genital herpes.  As explained, I cannot tell you when you acquired the condition, because it can lie dormant for an extended period of time.  However I am also concerned about the possibility of other sexually transmitted diseases because of the appearance of your cervix.  We treated you with antibiotics that should treat both gonorrhea and chlamydia, and I have written you a prescription that should help with the herpes outbreak.  It takes time for it to go away, however, and you can use over-the-counter ibuprofen and Tylenol as needed.  I strongly encourage you to follow-up with Dr. Leonides Schanz or another OB/GYN for follow-up.  You can call the number provided and ask to schedule an appointment for follow-up from an emergency department visit for STD and cervicitis.  Please avoid sexual contact until your rash has completely resolved.  Read through the included information.  Follow-up with OB/GYN, or return to the emergency department if you develop new or worsening symptoms that concern you.

## 2019-09-08 LAB — GC/CHLAMYDIA PROBE AMP
Chlamydia trachomatis, NAA: POSITIVE — AB
Neisseria Gonorrhoeae by PCR: NEGATIVE

## 2019-09-09 ENCOUNTER — Telehealth: Payer: Self-pay | Admitting: Emergency Medicine

## 2019-09-09 NOTE — Telephone Encounter (Addendum)
Called patient to inform of positive chlamydia test.  She was treated during ED visit.  Left message asking her to call me.  09/14/2019--patient called me back. Gave her results.  Discussed partner treatment.

## 2019-09-16 ENCOUNTER — Other Ambulatory Visit: Payer: Self-pay

## 2019-09-16 ENCOUNTER — Ambulatory Visit: Payer: Medicaid Other | Admitting: Advanced Practice Midwife

## 2019-09-16 VITALS — BP 120/76 | Ht 62.0 in | Wt 217.0 lb

## 2019-09-16 DIAGNOSIS — F17200 Nicotine dependence, unspecified, uncomplicated: Secondary | ICD-10-CM | POA: Insufficient documentation

## 2019-09-16 DIAGNOSIS — Z202 Contact with and (suspected) exposure to infections with a predominantly sexual mode of transmission: Secondary | ICD-10-CM | POA: Diagnosis not present

## 2019-09-16 DIAGNOSIS — Z113 Encounter for screening for infections with a predominantly sexual mode of transmission: Secondary | ICD-10-CM

## 2019-09-16 DIAGNOSIS — Z3009 Encounter for other general counseling and advice on contraception: Secondary | ICD-10-CM

## 2019-09-16 DIAGNOSIS — Z30011 Encounter for initial prescription of contraceptive pills: Secondary | ICD-10-CM

## 2019-09-16 LAB — WET PREP FOR TRICH, YEAST, CLUE
Trichomonas Exam: NEGATIVE
Yeast Exam: NEGATIVE

## 2019-09-16 LAB — PREGNANCY, URINE: Preg Test, Ur: NEGATIVE

## 2019-09-16 MED ORDER — AZITHROMYCIN 500 MG PO TABS
1000.0000 mg | ORAL_TABLET | Freq: Once | ORAL | Status: AC
  Administered 2019-09-16: 1000 mg via ORAL

## 2019-09-16 MED ORDER — NORGESTIM-ETH ESTRAD TRIPHASIC 0.18/0.215/0.25 MG-25 MCG PO TABS: 1.0000 | ORAL_TABLET | Freq: Every day | ORAL | 0 refills | Status: DC

## 2019-09-16 NOTE — Progress Notes (Signed)
PT and wet mount reviewed. Administered 1 gm [employer] per E. Sciora CNM. Given #1 pack of OTC. Instrcuted to start on the Sunday after starting next period.  Patient states that she should start period next week.  Aileen Fass, RN

## 2019-09-16 NOTE — Progress Notes (Signed)
STI clinic/screening visit  Subjective:  Anne Cochran is a 28 y.o. SHF G4P2 smoker female being seen today for an STI screening visit. The patient reports they do have symptoms.  Patient has the following medical conditions:   Patient Active Problem List   Diagnosis Date Noted  . Obesity (BMI 35.0-39.9 without comorbidity) 05/03/2019     No chief complaint on file.   HPI  Patient reports contact to Chlamydia and told on 09/13/19.  Boyfriend was treated on 09/13/19 and last sex 09/07/19.  States went to ER 09/06/19 for "swollen pea sized flesh colored bumps that appeared 09/12/19" and was given a "shot" for "cervicitis" and possible herpes.  LMP 08/24/19. Wants ocp's for birth control.  Last MJ 07/15/19.  Last ETOH 09/10/19  See flowsheet for further details and programmatic requirements.    The following portions of the patient's history were reviewed and updated as appropriate: allergies, current medications, past medical history, past social history, past surgical history and problem list.  Objective:  There were no vitals filed for this visit.  Physical Exam Vitals signs and nursing note reviewed.  Constitutional:      Appearance: Normal appearance.  HENT:     Head: Normocephalic and atraumatic.     Mouth/Throat:     Mouth: Mucous membranes are moist.     Pharynx: Oropharynx is clear. No oropharyngeal exudate or posterior oropharyngeal erythema.  Pulmonary:     Effort: Pulmonary effort is normal.  Abdominal:     General: Abdomen is flat.     Palpations: There is no mass.     Tenderness: There is no abdominal tenderness. There is no rebound.     Comments: Poor tone, soft without tenderness, increased adipose  Genitourinary:    General: Normal vulva.     Exam position: Lithotomy position.     Pubic Area: No rash or pubic lice.      Labia:        Right: No rash or lesion.        Left: No rash or lesion.      Vagina: Vaginal discharge (white creamy, ph inconclusive)  present. No erythema, bleeding or lesions.     Cervix: No cervical motion tenderness, discharge, friability, lesion or erythema.     Uterus: Normal.      Adnexa: Right adnexa normal and left adnexa normal.     Rectum: Normal.       Comments: These are areas of scarring with well healed, flat, sl erythematous round areas.  Pt states ER MD on 09/06/19 told her it might be HSV? Lymphadenopathy:     Head:     Right side of head: No preauricular or posterior auricular adenopathy.     Left side of head: No preauricular or posterior auricular adenopathy.     Cervical: No cervical adenopathy.     Upper Body:     Right upper body: No supraclavicular or axillary adenopathy.     Left upper body: No supraclavicular or axillary adenopathy.     Lower Body: No right inguinal adenopathy. No left inguinal adenopathy.  Skin:    General: Skin is warm and dry.     Findings: No rash.  Neurological:     Mental Status: She is alert and oriented to person, place, and time.       Assessment and Plan:  Anne Cochran is a 28 y.o. female presenting to the [facility] Department for STI screening  1. Screening examination for  venereal disease Treat wet mount per standing orders Immunization nurse consult Treat as contact to Chlamydia please - WET PREP FOR [place], YEAST, CLUE - Pregnancy, urine - Syphilis Serology, [place] [facility] - HIV [place] [facility] - Chlamydia/Gonorrhea [place] [facility]  2. Family planning Not using anything for birth control and wants ocp's  3. Encounter for initial prescription of contraceptive pills If BP=wnl today and PT neg today, may have OTC #1 I po daily to begin today.   Please counsel pt on need for abstinance/back up condoms next 7 days Please counsel pt to do PT 09/21/19 and call if positive     Return if symptoms worsen or fail to improve.  No future appointments.  Herbie Saxon, CNM

## 2020-02-13 ENCOUNTER — Telehealth: Payer: Self-pay | Admitting: Family Medicine

## 2020-03-02 NOTE — Telephone Encounter (Signed)
Closed. Mailen Newborn, RN  

## 2020-03-16 ENCOUNTER — Encounter: Payer: Self-pay | Admitting: Family Medicine

## 2020-03-16 ENCOUNTER — Ambulatory Visit: Payer: Medicaid Other | Admitting: Family Medicine

## 2020-03-16 ENCOUNTER — Other Ambulatory Visit: Payer: Self-pay

## 2020-03-16 DIAGNOSIS — Z113 Encounter for screening for infections with a predominantly sexual mode of transmission: Secondary | ICD-10-CM

## 2020-03-16 DIAGNOSIS — Z202 Contact with and (suspected) exposure to infections with a predominantly sexual mode of transmission: Secondary | ICD-10-CM

## 2020-03-16 LAB — WET PREP FOR TRICH, YEAST, CLUE
Trichomonas Exam: NEGATIVE
Yeast Exam: NEGATIVE

## 2020-03-16 MED ORDER — AZITHROMYCIN 500 MG PO TABS
1000.0000 mg | ORAL_TABLET | Freq: Once | ORAL | Status: AC
  Administered 2020-03-16: 1000 mg via ORAL

## 2020-03-16 MED ORDER — METRONIDAZOLE 500 MG PO TABS: 500.0000 mg | ORAL_TABLET | Freq: Two times a day (BID) | ORAL | 0 refills | Status: AC

## 2020-03-16 NOTE — Progress Notes (Signed)
Patient is a work in. Patient here stating her partner's partner was recently diagnosed and treated for Chlamydia. Her partner was also treated for Chlamydia. She is here for testing. She states she has a PE appointment at [facility] in the near future and will do blood work at that appointment.Burt Knack, RN

## 2020-03-16 NOTE — Progress Notes (Signed)
[facility] Department STI clinic/screening visit  Subjective:  Jyrah Blye is a 29 y.o. female being seen today for  Chief Complaint  Patient presents with  . Exposure to STD     The patient reports they do have symptoms. Patient reports that they do not desire a pregnancy in the next year. They reported they are not interested in discussing contraception today.   Patient has the following medical conditions:   Patient Active Problem List   Diagnosis Date Noted  . Smoker 0-4 cpd 09/16/2019  . Obesity (BMI 35.0-39.9 without comorbidity) 05/03/2019    HPI  Pt reports she has had increased vaginal discharge x a few months. She was recently told that her partner was a contact to chlamydia. She has gotten chlamydia from this partner in the past, would like treatment today.  See flowsheet for further details and programmatic requirements.    No LMP recorded. Last sex: 3 days ago BCM: none Desires EC? declines   No components found for: HCV  The following portions of the patient's history were reviewed and updated as appropriate: allergies, current medications, past medical history, past social history, past surgical history and problem list.  Objective:  There were no vitals filed for this visit.   Physical Exam Vitals and nursing note reviewed.  Constitutional:      Appearance: Normal appearance.  HENT:     Head: Normocephalic and atraumatic.     Mouth/Throat:     Mouth: Mucous membranes are moist.     Pharynx: Oropharynx is clear. No oropharyngeal exudate or posterior oropharyngeal erythema.  Pulmonary:     Effort: Pulmonary effort is normal.  Abdominal:     General: Abdomen is flat.     Palpations: There is no mass.     Tenderness: There is no abdominal tenderness. There is no rebound.  Genitourinary:    General: Normal vulva.     Exam position: Lithotomy position.     Pubic Area: No rash or pubic lice.      Labia:        Right: No rash or  lesion.        Left: No rash or lesion.      Vagina: Vaginal discharge (white, ph>4.5) present. No erythema, bleeding or lesions.     Cervix: No cervical motion tenderness, discharge, friability, lesion or erythema.     Uterus: Normal.      Adnexa: Right adnexa normal and left adnexa normal.     Rectum: Normal.  Lymphadenopathy:     Head:     Right side of head: No preauricular or posterior auricular adenopathy.     Left side of head: No preauricular or posterior auricular adenopathy.     Cervical: No cervical adenopathy.     Upper Body:     Right upper body: No supraclavicular or axillary adenopathy.     Left upper body: No supraclavicular or axillary adenopathy.     Lower Body: No right inguinal adenopathy. No left inguinal adenopathy.  Skin:    General: Skin is warm and dry.     Findings: No rash.  Neurological:     Mental Status: She is alert and oriented to person, place, and time.      Assessment and Plan:  Allyne Hebert is a 29 y.o. female presenting to the [facility] Department for STI screening   1. Screening examination for venereal disease -Pt with symptoms. Screenings today as below. Treat wet prep per standing order. -  Patient does not meet criteria for HepB, HepC Screening. Declines HIV and syphilis screenings. -Counseled on warning s/sx and when to seek care. Recommended condom use with all sex and discussed importance of condom use for STI prevention. - WET PREP FOR [place], YEAST, CLUE - Chlamydia/Gonorrhea [place] [facility]  2. Chlamydia contact -As pt is having symptoms and has gotten chlamydia from this partner in the past I will treat as a contact today as below.  -Pt counseled regarding medication, including to RTC if vomits < 2 hr after taking medicine. No known allergies to this medication. -Advised no sex for 7 days after both pt and partner completes treatment and encouraged condoms with all sex. - azithromycin (ZITHROMAX) tablet 1,000  mg     Return for screening as needed.  No future appointments.  Ann Held, PA-C

## 2020-03-16 NOTE — Progress Notes (Signed)
Wet prep reviewed-+BV treated per Maximiano Coss, PA; Sharlette Dense, RN

## 2020-03-26 ENCOUNTER — Telehealth: Payer: Self-pay

## 2020-03-26 DIAGNOSIS — A749 Chlamydial infection, unspecified: Secondary | ICD-10-CM

## 2020-03-26 NOTE — Telephone Encounter (Signed)
TC to patient. Verified ID via password/SS#. Informed of positive chlamydia.  Patient states received treatment at visit on 03/16/20 but had sex 3 days later with partner.  Instructed to eat before visit and have partner call for tx appt. Appt scheduled for re-tx. Anne Campbell, RN

## 2020-03-27 ENCOUNTER — Other Ambulatory Visit: Payer: Self-pay

## 2020-03-27 ENCOUNTER — Ambulatory Visit: Payer: Self-pay

## 2020-03-27 DIAGNOSIS — A749 Chlamydial infection, unspecified: Secondary | ICD-10-CM

## 2020-03-27 MED ORDER — AZITHROMYCIN 500 MG PO TABS
1000.0000 mg | ORAL_TABLET | Freq: Once | ORAL | Status: AC
  Administered 2020-03-27: 1000 mg via ORAL

## 2020-03-27 NOTE — Progress Notes (Signed)
Pt to clinic for tx of chlamydia. Pt received medication at 03/16/20 visit but states she had sex with an untreated partner approx 3 days after taking medicine. Pt treated per Dr. Lyndel Safe standing order.

## 2020-04-23 ENCOUNTER — Other Ambulatory Visit: Payer: Self-pay

## 2020-04-23 ENCOUNTER — Ambulatory Visit: Payer: Medicaid Other

## 2020-04-23 ENCOUNTER — Ambulatory Visit: Payer: Medicaid Other | Admitting: Advanced Practice Midwife

## 2020-04-23 ENCOUNTER — Encounter: Payer: Self-pay | Admitting: Advanced Practice Midwife

## 2020-04-23 DIAGNOSIS — Z202 Contact with and (suspected) exposure to infections with a predominantly sexual mode of transmission: Secondary | ICD-10-CM

## 2020-04-23 DIAGNOSIS — Z113 Encounter for screening for infections with a predominantly sexual mode of transmission: Secondary | ICD-10-CM

## 2020-04-23 LAB — WET PREP FOR TRICH, YEAST, CLUE
Trichomonas Exam: NEGATIVE
Yeast Exam: NEGATIVE

## 2020-04-23 MED ORDER — AZITHROMYCIN 500 MG PO TABS
1000.0000 mg | ORAL_TABLET | Freq: Once | ORAL | Status: AC
  Administered 2020-04-23: 1000 mg via ORAL

## 2020-04-23 NOTE — Progress Notes (Signed)
[employer] results reviewed. Per standing orders no treatment indicated. Patient treated as a contact to NGU. Tawny Hopping, RN

## 2020-04-23 NOTE — Progress Notes (Addendum)
Here today for STD screening. Declines bloodwork. "Thinks" she may be a contact to NGU." Tawny Hopping, RN

## 2020-04-23 NOTE — Progress Notes (Signed)
[facility] Department STI clinic/screening visit  Subjective:  Anne Cochran is a 29 y.o. SHF G4P2 exsmoker female being seen today for an STI screening visit. The patient reports they do not have symptoms.  Patient reports that they do not desire a pregnancy in the next year.   They reported they are not interested in discussing contraception today.  No LMP recorded.   Patient has the following medical conditions:   Patient Active Problem List   Diagnosis Date Noted  . Smoker 0-4 cpd 09/16/2019  . Obesity (BMI 35.0-39.9 without comorbidity) 05/03/2019    Chief Complaint  Patient presents with  . SEXUALLY TRANSMITTED DISEASE    HPI  Patient reports thinks "may be a contact to NGU" partner told her 2 wks ago and he was treated.  LMP 02/25/20.  Hx chlamydia mid May per pt.  Last sex 04/13/20 without condom.  LMP 02/25/20.  Last MJ April 2021.  Last ETOH  04/21/20 (6 beers) q weekend.  Last cig April.    Last HIV test per patient/review of record was 09/16/19 Patient reports last pap was 08/03/18 neg  See flowsheet for further details and programmatic requirements.    The following portions of the patient's history were reviewed and updated as appropriate: allergies, current medications, past medical history, past social history, past surgical history and problem list.  Objective:  There were no vitals filed for this visit.  Physical Exam Vitals and nursing note reviewed.  Constitutional:      Appearance: Normal appearance. She is obese.  HENT:     Head: Normocephalic and atraumatic.     Mouth/Throat:     Mouth: Mucous membranes are moist.     Pharynx: Oropharynx is clear. No oropharyngeal exudate or posterior oropharyngeal erythema.  Eyes:     Conjunctiva/sclera: Conjunctivae normal.  Pulmonary:     Effort: Pulmonary effort is normal.  Abdominal:     Palpations: Abdomen is soft. There is no mass.     Tenderness: There is no abdominal tenderness. There is no  rebound.     Comments: Poor tone, soft without tenderness, increased adipose  Genitourinary:    General: Normal vulva.     Exam position: Lithotomy position.     Pubic Area: No rash or pubic lice.      Labia:        Right: No rash or lesion.        Left: No rash or lesion.      Vagina: Vaginal discharge (small amt white creamy leukorrhea, ph<4.5) present. No erythema, bleeding or lesions.     Cervix: Normal.     Uterus: Normal.      Adnexa: Right adnexa normal and left adnexa normal.     Rectum: Normal.  Lymphadenopathy:     Head:     Right side of head: No preauricular or posterior auricular adenopathy.     Left side of head: No preauricular or posterior auricular adenopathy.     Cervical: No cervical adenopathy.     Upper Body:     Right upper body: No supraclavicular or axillary adenopathy.     Left upper body: No supraclavicular or axillary adenopathy.     Lower Body: No right inguinal adenopathy. No left inguinal adenopathy.  Skin:    General: Skin is warm and dry.     Findings: No rash.  Neurological:     Mental Status: She is alert and oriented to person, place, and time.  Assessment and Plan:  Anne Cochran is a 29 y.o. female presenting to the [facility] Department for STI screening  1. Screening examination for venereal disease Treat wet mount per standing orders Immunization nurse consult Treat as contact to NGU please - [facility], YEAST, [facility]     Return if symptoms worsen or fail to improve.  No future appointments.  Herbie Saxon, CNM

## 2020-07-04 ENCOUNTER — Ambulatory Visit (LOCAL_COMMUNITY_HEALTH_CENTER): Payer: Medicaid Other

## 2020-07-04 ENCOUNTER — Other Ambulatory Visit: Payer: Self-pay

## 2020-07-04 DIAGNOSIS — Z3202 Encounter for pregnancy test, result negative: Secondary | ICD-10-CM

## 2020-07-04 LAB — PREGNANCY, URINE: Preg Test, Ur: NEGATIVE

## 2020-07-04 NOTE — Progress Notes (Signed)
RN unable to locate client in Nurse Clinic waiting room, main waiting room, [facility] public bathroom or lab. Per lab, client had negative UPT.  Jossie Ng, RN

## 2020-07-09 ENCOUNTER — Ambulatory Visit: Payer: Medicaid Other

## 2020-07-13 ENCOUNTER — Ambulatory Visit: Payer: Medicaid Other

## 2020-07-17 ENCOUNTER — Ambulatory Visit: Payer: Medicaid Other

## 2020-09-23 ENCOUNTER — Emergency Department: Payer: Medicaid Other

## 2020-09-23 ENCOUNTER — Encounter: Payer: Self-pay | Admitting: Emergency Medicine

## 2020-09-23 ENCOUNTER — Other Ambulatory Visit: Payer: Self-pay

## 2020-09-23 ENCOUNTER — Emergency Department
Admission: EM | Admit: 2020-09-23 | Discharge: 2020-09-23 | Disposition: A | Discharge status: Home / Self Care | Payer: Medicaid Other | Attending: Emergency Medicine | Admitting: Emergency Medicine

## 2020-09-23 DIAGNOSIS — S060X0A Concussion without loss of consciousness, initial encounter: Secondary | ICD-10-CM | POA: Insufficient documentation

## 2020-09-23 DIAGNOSIS — M79644 Pain in right finger(s): Secondary | ICD-10-CM | POA: Diagnosis not present

## 2020-09-23 DIAGNOSIS — Z87891 Personal history of nicotine dependence: Secondary | ICD-10-CM | POA: Diagnosis not present

## 2020-09-23 DIAGNOSIS — M25521 Pain in right elbow: Secondary | ICD-10-CM

## 2020-09-23 LAB — CBC WITH DIFFERENTIAL/PLATELET
Abs Immature Granulocytes: 0.05 10*3/uL (ref 0.00–0.07)
Basophils Absolute: 0 10*3/uL (ref 0.0–0.1)
Basophils Relative: 0 %
Eosinophils Absolute: 0 10*3/uL (ref 0.0–0.5)
Eosinophils Relative: 0 %
HCT: 41.5 % (ref 36.0–46.0)
Hemoglobin: 13.8 g/dL (ref 12.0–15.0)
Immature Granulocytes: 1 %
Lymphocytes Relative: 25 %
Lymphs Abs: 1.9 10*3/uL (ref 0.7–4.0)
MCH: 31 pg (ref 26.0–34.0)
MCHC: 33.3 g/dL (ref 30.0–36.0)
MCV: 93.3 fL (ref 80.0–100.0)
Monocytes Absolute: 0.4 10*3/uL (ref 0.1–1.0)
Monocytes Relative: 5 %
Neutro Abs: 5.4 10*3/uL (ref 1.7–7.7)
Neutrophils Relative %: 69 %
Platelets: 309 10*3/uL (ref 150–400)
RBC: 4.45 MIL/uL (ref 3.87–5.11)
RDW: 12.8 % (ref 11.5–15.5)
WBC: 7.8 10*3/uL (ref 4.0–10.5)
nRBC: 0 % (ref 0.0–0.2)

## 2020-09-23 LAB — COMPREHENSIVE METABOLIC PANEL
ALT: 23 U/L (ref 0–44)
AST: 23 U/L (ref 15–41)
Albumin: 4.6 g/dL (ref 3.5–5.0)
Alkaline Phosphatase: 60 U/L (ref 38–126)
Anion gap: 14 (ref 5–15)
BUN: 8 mg/dL (ref 6–20)
CO2: 22 mmol/L (ref 22–32)
Calcium: 8.9 mg/dL (ref 8.9–10.3)
Chloride: 104 mmol/L (ref 98–111)
Creatinine, Ser: 0.7 mg/dL (ref 0.44–1.00)
GFR, Estimated: >60 mL/min (ref >60)
Glucose, Bld: 117 mg/dL — ABNORMAL HIGH (ref 70–99)
Potassium: 3.4 mmol/L — ABNORMAL LOW (ref 3.5–5.1)
Sodium: 140 mmol/L (ref 135–145)
Total Bilirubin: 0.7 mg/dL (ref 0.3–1.2)
Total Protein: 8.7 g/dL — ABNORMAL HIGH (ref 6.5–8.1)

## 2020-09-23 LAB — URINALYSIS, COMPLETE (UACMP) WITH MICROSCOPIC
Bilirubin Urine: NEGATIVE
Glucose, UA: NEGATIVE mg/dL
Ketones, ur: NEGATIVE mg/dL
Leukocytes,Ua: NEGATIVE
Nitrite: NEGATIVE
Protein, ur: NEGATIVE mg/dL
Specific Gravity, Urine: 1.005 (ref 1.005–1.030)
pH: 5 (ref 5.0–8.0)

## 2020-09-23 LAB — PREGNANCY, URINE: Preg Test, Ur: NEGATIVE
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
## 2020-09-23 NOTE — ED Notes (Signed)
Urine preg negative

## 2020-09-23 NOTE — ED Notes (Signed)
Pt c/o right sided flank pain at this time. MD Bradler aware
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
## 2021-01-07 ENCOUNTER — Other Ambulatory Visit: Payer: Self-pay

## 2021-01-07 ENCOUNTER — Encounter: Payer: Self-pay | Admitting: Obstetrics and Gynecology

## 2021-01-07 ENCOUNTER — Ambulatory Visit (INDEPENDENT_AMBULATORY_CARE_PROVIDER_SITE_OTHER): Payer: Medicaid Other | Admitting: Obstetrics and Gynecology

## 2021-01-07 ENCOUNTER — Other Ambulatory Visit (HOSPITAL_COMMUNITY)
Admission: RE | Admit: 2021-01-07 | Discharge: 2021-01-07 | Disposition: A | Discharge status: Home / Self Care | Payer: Medicaid Other | Source: Ambulatory Visit | Attending: Obstetrics and Gynecology | Admitting: Obstetrics and Gynecology

## 2021-01-07 ENCOUNTER — Ambulatory Visit (LOCAL_COMMUNITY_HEALTH_CENTER): Payer: Medicaid Other

## 2021-01-07 VITALS — BP 126/82 | HR 96 | Wt 203.0 lb

## 2021-01-07 VITALS — BP 120/81 | Ht 62.0 in | Wt 198.5 lb

## 2021-01-07 DIAGNOSIS — O99212 Obesity complicating pregnancy, second trimester: Secondary | ICD-10-CM | POA: Insufficient documentation

## 2021-01-07 DIAGNOSIS — Z124 Encounter for screening for malignant neoplasm of cervix: Secondary | ICD-10-CM | POA: Diagnosis not present

## 2021-01-07 DIAGNOSIS — Z369 Encounter for antenatal screening, unspecified: Secondary | ICD-10-CM

## 2021-01-07 DIAGNOSIS — Z113 Encounter for screening for infections with a predominantly sexual mode of transmission: Secondary | ICD-10-CM | POA: Insufficient documentation

## 2021-01-07 DIAGNOSIS — O281 Abnormal biochemical finding on antenatal screening of mother: Secondary | ICD-10-CM

## 2021-01-07 DIAGNOSIS — Z0283 Encounter for blood-alcohol and blood-drug test: Secondary | ICD-10-CM

## 2021-01-07 DIAGNOSIS — Z3201 Encounter for pregnancy test, result positive: Secondary | ICD-10-CM

## 2021-01-07 DIAGNOSIS — N926 Irregular menstruation, unspecified: Secondary | ICD-10-CM

## 2021-01-07 DIAGNOSIS — Z3480 Encounter for supervision of other normal pregnancy, unspecified trimester: Secondary | ICD-10-CM | POA: Insufficient documentation

## 2021-01-07 LAB — PREGNANCY, URINE: Preg Test, Ur: POSITIVE — AB

## 2021-01-07 MED ORDER — PRENATAL 27-0.8 MG PO TABS: 1.0000 | ORAL_TABLET | Freq: Every day | ORAL | 0 refills | Status: AC

## 2021-01-07 NOTE — Progress Notes (Signed)
New Obstetric Patient H&P    Chief Complaint: "Desires prenatal care"   History of Present Illness: Patient is a 30 y.o. [id number] Hispanic or Latino female, presents with amenorrhea and positive home pregnancy test. Patient's last menstrual period was 09/24/2020 (approximate). and based on her  LMP, her EDD is Estimated Date of Delivery: 07/01/21 and her EGA is [redacted]w[redacted]d. Cycles are 5-6 days, regular, and occur approximately every : 28 days. Her last pap smear was 3 years ago and was negative.    She had a urine pregnancy test which was positive 3 week(s)  ago. Her last menstrual period was normal and lasted for  3 day(s). Since her LMP she claims she has experienced fatigue, increased breast tenderness, and nausea in the mornings. She denies vaginal bleeding. Her past medical history is noncontributory. Her prior pregnancies are notable for G1- SAB with D&C, G2- 38 wk FT NSVD without complications, G3- 38 wk FT NSVD without complications, G4- SAB. Pelvis proven to 7lb 14 oz.  Since her LMP, she admits to the use of tobacco products  yes She claims she has gained  18 pounds since the start of her pregnancy - reviewed recommendations for weight gain in pregnancy There are cats in the home in the home  no  She admits close contact with children on a regular basis  yes  She has had chicken pox in the past no She has had Tuberculosis exposures, symptoms, or previously tested positive for TB   no Current or past history of domestic violence. no  Genetic Screening/Teratology Counseling: (Includes patient, baby's father, or anyone in either family with:)   1. Patient's age >/= 110 [facility]  no 2. Thalassemia ([place], [place], Mediterranean, or Asian background): MCV<80  no 3. Neural tube defect (meningomyelocele, spina bifida, anencephaly)  no 4. Congenital heart defect  no  5. Down syndrome  no 6. Tay-Sachs (Jewish, [place]  no 7. Canavan's Disease  no 8. Sickle cell disease or trait  (African)  no  9. Hemophilia or other blood disorders  no  10. Muscular dystrophy  no  11. Cystic fibrosis  no  12. Huntington's Chorea  no  13. Mental retardation/autism  no 14. Other inherited genetic or chromosomal disorder  no 15. Maternal metabolic disorder (DM, PKU, etc)  no 16. Patient or FOB with a child with a birth defect not listed above no  16a. Patient or FOB with a birth defect themselves no 17. Recurrent pregnancy loss, or stillbirth  no  18. Any medications since LMP other than prenatal vitamins (include vitamins, supplements, OTC meds, drugs, alcohol)  no 19. Any other genetic/environmental exposure to discuss  no  Infection History:   1. Lives with someone with TB or TB exposed  no  2. Patient or partner has history of genital herpes  no 3. Rash or viral illness since LMP  no 4. History of STI (GC, CT, HPV, syphilis, HIV)  Yes -CT in past year - patient reports she had difficulty taking antibiotics as rx'd 5. History of recent travel :  no  Other pertinent information:  Patient currently employed at [employer] in [place]. Lives with two children and husband, "Jonathon."   Review of Systems:10 point review of systems negative unless otherwise noted in HPI  Past Medical History:  Patient Active Problem List   Diagnosis Date Noted  . Maternal obesity, antepartum, second trimester 01/07/2021  . Supervision of other normal pregnancy, antepartum 01/07/2021     Nursing  Staff Provider  Office Location  [facility] Dating   LMP  Language  English Anatomy US    Flu Vaccine   UTD Genetic Screen  NIPS:  desires  TDaP vaccine    Hgb A1C or  GTT A1C: Third trimester :   Rhogam   n/a   LAB RESULTS   Feeding Plan  breast Blood Type     Contraception   Antibody    Circumcision  Rubella    Pediatrician   RPR     Support Person  Jonathon HBsAg     Prenatal Classes   HIV     COVID  vax x2 Varicella @varicellaresultconsole @   BTL Consent  GBS  (For PCN allergy, check  sensitivities)        VBAC Consent  n/a Pap  collected 01/07/21    Hgb Electro   n/a    CF  declined     SMA  declined           . Smoker 0-4 cpd 09/16/2019    Past Surgical History:  Past Surgical History:  Procedure Laterality Date  . CHOLECYSTECTOMY    . DILATION AND EVACUATION  2005   incomplete AB    Gynecologic History: Patient's last menstrual period was 09/24/2020 (approximate).  Obstetric History: 09/26/2020  Family History:  Family History  Problem Relation Age of Onset  . Cancer Mother 68  . Diabetes Mother   . Hypertension Mother   . Thyroid disease Mother   . Breast cancer Maternal Grandmother 46  . Cancer Maternal Grandmother   . Diabetes Maternal Grandmother   . Heart attack Maternal Grandfather     Social History:  Social History   Socioeconomic History  . Marital status: Married    Spouse name: Not on file  . Number of children: 2  . Years of education: Not on file  . Highest education level: Not on file  Occupational History  . Not on file  Tobacco Use  . Smoking status: Former Smoker    Types: Cigarettes    Quit date: 10/10/2020    Years since quitting: 0.2  . Smokeless tobacco: Never Used  Vaping Use  . Vaping Use: Former  . Quit date: 12/11/2020  . Substances: Nicotine  Substance and Sexual Activity  . Alcohol use: Not Currently    Comment: last use 3 weeks ago-  beer x2  . Drug use: Not Currently  . Sexual activity: Yes    Partners: Male    Birth control/protection: None, Pill    Comment: hx mirena, ocp  Other Topics Concern  . Not on file  Social History Narrative  . Not on file   Social Determinants of Health   Financial Resource Strain: Not on file  Food Insecurity: Not on file  Transportation Needs: Not on file  Physical Activity: Not on file  Stress: Not on file  Social Connections: Not on file  Intimate Partner Violence: Not At Risk  . Fear of Current or Ex-Partner: No  . Emotionally Abused: No  . Physically  Abused: No  . Sexually Abused: No    Allergies:  Allergies  Allergen Reactions  . Penicillins   . Sulfa Antibiotics     Medications: Prior to Admission medications   Medication Sig Start Date End Date Taking? Authorizing Provider  Prenatal Vit-Fe Fumarate-FA (MULTIVITAMIN-PRENATAL) 27-0.8 MG TABS tablet Take 1 tablet by mouth daily at 12 noon. 01/07/21 04/17/21  06/17/21, MD    Physical Exam Vitals:  Blood pressure 126/82, pulse 96, weight 203 lb (92.1 kg), last menstrual period 09/24/2020.  General: NAD HEENT: normocephalic, anicteric Thyroid: no enlargement, no palpable nodules Pulmonary: No increased work of breathing, CTAB Cardiovascular: RRR, distal pulses 2+ Abdomen: NABS, soft, non-tender, non-distended.  Umbilicus without lesions.  No hepatomegaly, splenomegaly or masses palpable. No evidence of hernia  Genitourinary:  External: Normal external female genitalia.  Normal urethral meatus, normal  Bartholin's and Skene's glands.    Vagina: Normal vaginal mucosa, no evidence of prolapse.    Cervix: Grossly normal in appearance, no bleeding  Uterus: Enlarged (size c/w 10-12 week dates, exam limited by body habitus), mobile, normal contour.  No CMT  Adnexa: ovaries non-enlarged, no adnexal masses  Rectal: deferred Extremities: no edema, erythema, or tenderness Neurologic: Grossly intact Psychiatric: mood appropriate, affect full   Assessment: 30 y.o. [id number] at [redacted]w[redacted]d presenting to initiate prenatal care  Plan: 1) Avoid alcoholic beverages. 2) Patient encouraged not to smoke.  3) Discontinue the use of all non-medicinal drugs and chemicals.  4) Take prenatal vitamins daily.  5) Nutrition, food safety (fish, cheese advisories, and high nitrite foods) and exercise discussed. 6) Hospital and practice style discussed with cross coverage system.  7) Genetic Screening, such as with 1st Trimester Screening, cell free fetal DNA, AFP testing, and Ultrasound, as well  as with amniocentesis and CVS as appropriate, is discussed with patient. At the conclusion of today's visit patient requested genetic testing - patient desires NIPS testing, declines Inheritest and SS screening  8) NOB labs/pap/GC/CT/urine collected today  9) RTC for [place] in 1 week for dating and ROB  Zipporah Plants, CNM, MSN [facility], [facility] 01/07/2021, 2:39 PM

## 2021-01-07 NOTE — Progress Notes (Signed)
UPT positive. Plans prenatal care at [facility]. To clerk for Medicaid/PW. Jerel Shepherd, RN

## 2021-01-08 LAB — RPR+RH+ABO+RUB AB+AB SCR+CB...
Antibody Screen: NEGATIVE
HIV Screen 4th Generation wRfx: NONREACTIVE
Hematocrit: 38.4 % (ref 34.0–46.6)
Hemoglobin: 12.9 g/dL (ref 11.1–15.9)
Hepatitis B Surface Ag: NEGATIVE
MCH: 30.9 pg (ref 26.6–33.0)
MCHC: 33.6 g/dL (ref 31.5–35.7)
MCV: 92 fL (ref 79–97)
Platelets: 248 10*3/uL (ref 150–450)
RBC: 4.18 x10E6/uL (ref 3.77–5.28)
RDW: 13.1 % (ref 11.7–15.4)
RPR Ser Ql: NONREACTIVE
Rh Factor: POSITIVE
Rubella Antibodies, IGG: 1.77 index (ref >0.99)
Varicella zoster IgG: 319 index (ref >165)
WBC: 8 10*3/uL (ref 3.4–10.8)

## 2021-01-08 LAB — HGB A1C W/O EAG: Hgb A1c MFr Bld: 5.2 % (ref 4.8–5.6)

## 2021-01-09 LAB — CYTOLOGY - PAP
Comment: NEGATIVE
Diagnosis: NEGATIVE
High risk HPV: NEGATIVE

## 2021-01-09 LAB — CERVICOVAGINAL ANCILLARY ONLY
Chlamydia: NEGATIVE
Comment: NEGATIVE
Comment: NEGATIVE
Comment: NORMAL
Neisseria Gonorrhea: NEGATIVE
Trichomonas: NEGATIVE

## 2021-01-09 LAB — URINE CULTURE: Organism ID, Bacteria: NO GROWTH

## 2021-01-11 ENCOUNTER — Emergency Department: Payer: Medicaid Other

## 2021-01-11 ENCOUNTER — Emergency Department
Admission: EM | Admit: 2021-01-11 | Discharge: 2021-01-11 | Disposition: A | Discharge status: Home / Self Care | Payer: Medicaid Other | Attending: Emergency Medicine | Admitting: Emergency Medicine

## 2021-01-11 ENCOUNTER — Encounter: Payer: Self-pay | Admitting: Emergency Medicine

## 2021-01-11 ENCOUNTER — Other Ambulatory Visit: Payer: Self-pay

## 2021-01-11 DIAGNOSIS — Z3A01 Less than 8 weeks gestation of pregnancy: Secondary | ICD-10-CM | POA: Diagnosis not present

## 2021-01-11 DIAGNOSIS — O200 Threatened abortion: Secondary | ICD-10-CM | POA: Diagnosis not present

## 2021-01-11 DIAGNOSIS — O4690 Antepartum hemorrhage, unspecified, unspecified trimester: Secondary | ICD-10-CM

## 2021-01-11 DIAGNOSIS — Z87891 Personal history of nicotine dependence: Secondary | ICD-10-CM | POA: Diagnosis not present

## 2021-01-11 DIAGNOSIS — O209 Hemorrhage in early pregnancy, unspecified: Secondary | ICD-10-CM

## 2021-01-11 DIAGNOSIS — O208 Other hemorrhage in early pregnancy: Secondary | ICD-10-CM | POA: Diagnosis present

## 2021-01-11 LAB — CBC
HCT: 37 % (ref 36.0–46.0)
Hemoglobin: 12.5 g/dL (ref 12.0–15.0)
MCH: 31.8 pg (ref 26.0–34.0)
MCHC: 33.8 g/dL (ref 30.0–36.0)
MCV: 94.1 fL (ref 80.0–100.0)
Platelets: 215 10*3/uL (ref 150–400)
RBC: 3.93 MIL/uL (ref 3.87–5.11)
RDW: 12.8 % (ref 11.5–15.5)
WBC: 8.2 10*3/uL (ref 4.0–10.5)
nRBC: 0 % (ref 0.0–0.2)

## 2021-01-11 LAB — COMPREHENSIVE METABOLIC PANEL
ALT: 19 U/L (ref 0–44)
AST: 17 U/L (ref 15–41)
Albumin: 3.8 g/dL (ref 3.5–5.0)
Alkaline Phosphatase: 43 U/L (ref 38–126)
Anion gap: 3 — ABNORMAL LOW (ref 5–15)
BUN: 14 mg/dL (ref 6–20)
CO2: 28 mmol/L (ref 22–32)
Calcium: 8.6 mg/dL — ABNORMAL LOW (ref 8.9–10.3)
Chloride: 106 mmol/L (ref 98–111)
Creatinine, Ser: 0.54 mg/dL (ref 0.44–1.00)
GFR, Estimated: >60 mL/min (ref >60)
Glucose, Bld: 93 mg/dL (ref 70–99)
Potassium: 3.3 mmol/L — ABNORMAL LOW (ref 3.5–5.1)
Sodium: 137 mmol/L (ref 135–145)
Total Bilirubin: 0.7 mg/dL (ref 0.3–1.2)
Total Protein: 6.7 g/dL (ref 6.5–8.1)

## 2021-01-11 LAB — HCG, QUANTITATIVE, PREGNANCY: hCG, Beta Chain, Quant, S: 710 m[IU]/mL — ABNORMAL HIGH (ref <5)

## 2021-01-11 NOTE — ED Triage Notes (Signed)
First Nurse Note:  Arrives with C/O vaginal bleeding today.  States she is [redacted] weeks pregnant.    AAOx3.  Skin warm and dry. NAD

## 2021-01-11 NOTE — ED Triage Notes (Signed)
Pt to ED via POV with c/o light pink spotting. Pt states started earlier today. Pt states is a G5P2L2. Pt states 2 previous miscarriages, is currently due 07/01/21. Pt states she went to the bathroom and saw "goo with light pink blood".

## 2021-01-11 NOTE — Discharge Instructions (Signed)
Your hCG level today was 700.  Please keep your appointment with [facility] on March 9 for recheck of your hCG level and ultrasound.  Return to the ER if you have severe pain or heavy bleeding or other new concerns.

## 2021-01-11 NOTE — ED Provider Notes (Signed)
[facility] Emergency Department Provider Note  ____________________________________________  Time seen: Approximately 8:28 PM  I have reviewed the triage vital signs and the nursing notes.   HISTORY  Chief Complaint Vaginal Bleeding    HPI Anne Cochran is a 30 y.o. female with no significant past medical history who comes the ED due to vaginal bleeding in pregnancy.  Reports that her LMP was September 24, 2020, but she did also have 3 days of vaginal bleeding at the end of December 2021.  She had a first prenatal visit with [facility] midwife, is scheduled for follow-up labs and ultrasound on March 9.  Today she started having vaginal bleeding described as light pink spotting.  She also feels like she passed a strand of clotted blood.  Denies pelvic pain dizziness fever chest pain or shortness of breath.   No cramping.  No aggravating or alleviating factors.  Symptoms have since resolved.  She is now feeling normal.     Past Medical History:  Diagnosis Date  . Bacterial vaginosis   . Obesity (BMI 35.0-39.9 without comorbidity)      Patient Active Problem List   Diagnosis Date Noted  . Maternal obesity, antepartum, second trimester 01/07/2021  . Supervision of other normal pregnancy, antepartum 01/07/2021  . Smoker 0-4 cpd 09/16/2019     Past Surgical History:  Procedure Laterality Date  . CHOLECYSTECTOMY    . DILATION AND EVACUATION  2005   incomplete AB     Prior to Admission medications   Medication Sig Start Date End Date Taking? Authorizing Provider  Prenatal Vit-Fe Fumarate-FA (MULTIVITAMIN-PRENATAL) 27-0.8 MG TABS tablet Take 1 tablet by mouth daily at 12 noon. 01/07/21 04/17/21  Federico Flake, MD     Allergies Penicillins and Sulfa antibiotics   Family History  Problem Relation Age of Onset  . Cancer Mother 51  . Diabetes Mother   . Hypertension Mother   . Thyroid disease Mother   . Breast cancer Maternal Grandmother 39   . Cancer Maternal Grandmother   . Diabetes Maternal Grandmother   . Heart attack Maternal Grandfather     Social History Social History   Tobacco Use  . Smoking status: Former Smoker    Types: Cigarettes    Quit date: 10/10/2020    Years since quitting: 0.2  . Smokeless tobacco: Never Used  Vaping Use  . Vaping Use: Former  . Quit date: 12/11/2020  . Substances: Nicotine  Substance Use Topics  . Alcohol use: Not Currently    Comment: last use 3 weeks ago-  beer x2  . Drug use: Not Currently    Review of Systems  Constitutional:   No fever or chills.  ENT:   No sore throat. No rhinorrhea. Cardiovascular:   No chest pain or syncope. Respiratory:   No dyspnea or cough. Gastrointestinal:   Negative for abdominal pain, vomiting and diarrhea.  Musculoskeletal:   Negative for focal pain or swelling All other systems reviewed and are negative except as documented above in ROS and HPI.  ____________________________________________   PHYSICAL EXAM:  VITAL SIGNS: ED Triage Vitals  Enc Vitals Group     BP 01/11/21 1559 138/84     Pulse Rate 01/11/21 1559 70     Resp 01/11/21 1559 20     Temp 01/11/21 1559 98 F (36.7 C)     Temp Source 01/11/21 1559 Oral     SpO2 01/11/21 1559 100 %     Weight 01/11/21 1558 200 lb  2.8 oz (90.8 kg)     Height 01/11/21 1558 5\' 2"  (1.575 m)     Head Circumference --      Peak Flow --      Pain Score 01/11/21 1600 0     Pain Loc --      Pain Edu? --      Excl. in GC? --     Vital signs reviewed, nursing assessments reviewed.   Constitutional:   Alert and oriented. Non-toxic appearance. Eyes:   Conjunctivae are normal. EOMI. PERRL. ENT      Head:   Normocephalic and atraumatic.      Nose:   Wearing a mask.      Mouth/Throat:   Wearing a mask.      Neck:   No meningismus. Full ROM. Hematological/Lymphatic/Immunilogical:   No cervical lymphadenopathy. Cardiovascular:   RRR. Symmetric bilateral radial and DP pulses.  No murmurs. Cap  refill less than 2 seconds. Respiratory:   Normal respiratory effort without tachypnea/retractions. Breath sounds are clear and equal bilaterally. No wheezes/rales/rhonchi. Gastrointestinal:   Soft and nontender. Non distended. There is no CVA tenderness.  No rebound, rigidity, or guarding. Genitourinary:   deferred Musculoskeletal:   Normal range of motion in all extremities. No joint effusions.  No lower extremity tenderness.  No edema. Neurologic:   Normal speech and language.  Motor grossly intact. No acute focal neurologic deficits are appreciated.  Skin:    Skin is warm, dry and intact. No rash noted.  No petechiae, purpura, or bullae.  ____________________________________________    LABS (pertinent positives/negatives) (all labs ordered are listed, but only abnormal results are displayed) Labs Reviewed  COMPREHENSIVE METABOLIC PANEL - Abnormal; Notable for the following components:      Result Value   Potassium 3.3 (*)    Calcium 8.6 (*)    Anion gap 3 (*)    All other components within normal limits  HCG, QUANTITATIVE, PREGNANCY - Abnormal; Notable for the following components:   hCG, Beta Chain, Quant, S 710 (*)    All other components within normal limits  CBC  URINALYSIS, COMPLETE (UACMP) WITH MICROSCOPIC   ____________________________________________   EKG    ____________________________________________    RADIOLOGY  03/13/21 OB LESS THAN 14 WEEKS WITH OB TRANSVAGINAL  Result Date: 01/11/2021 CLINICAL DATA:  Vaginal bleeding EXAM: OBSTETRIC <14 WK 03/13/2021 AND TRANSVAGINAL OB US TECHNIQUE: Both transabdominal and transvaginal ultrasound examinations were performed for complete evaluation of the gestation as well as the maternal uterus, adnexal regions, and pelvic cul-de-sac. Transvaginal technique was performed to assess early pregnancy. COMPARISON:  None. FINDINGS: Intrauterine gestational sac: Probable small intrauterine gestational sac Yolk sac:  Not Visualized. Embryo:   Not Visualized. Cardiac Activity: Not Visualized. MSD: 6  mm   5 w   2  d Subchorionic hemorrhage:  None visualized. Maternal uterus/adnexae: Left ovary measures 2.3 x 3.2 x 2 cm. The right ovary measures 3.1 by 3.5 x 2.6 cm and contains probable corpus luteum. No significant free fluid IMPRESSION: Probable early intrauterine gestational sac, but no yolk sac, fetal pole, or cardiac activity yet visualized. Recommend follow-up quantitative B-HCG levels and follow-up US in 14 days to confirm and assess viability. This recommendation follows SRU consensus guidelines: Diagnostic Criteria for Nonviable Pregnancy Early in the First Trimester. [place] Med [id number]. Electronically Signed   By: ; [id number] M.D.   On: 01/11/2021 17:16    ____________________________________________   PROCEDURES Procedures  ____________________________________________    CLINICAL IMPRESSION /  ASSESSMENT AND PLAN / ED COURSE  Medications ordered in the ED: Medications - No data to display  Pertinent labs & imaging results that were available during my care of the patient were reviewed by me and considered in my medical decision making (see chart for details).  Jernie Schutt was evaluated in Emergency Department on 01/11/2021 for the symptoms described in the history of present illness. She was evaluated in the context of the global COVID-19 pandemic, which necessitated consideration that the patient might be at risk for infection with the SARS-CoV-2 virus that causes COVID-19. Institutional protocols and algorithms that pertain to the evaluation of patients at risk for COVID-19 are in a state of rapid change based on information released by regulatory bodies including the CDC and federal and state organizations. These policies and algorithms were followed during the patient's care in the ED.   Patient presents with vaginal bleeding and for trimester pregnancy.  Gestational age is difficult to ascertain due to  uncertain LMP.  Review of EMR shows blood type is a positive.  hCG is 700.  Pelvic ultrasound obtained to evaluate for ectopic pregnancy, appears to show an intrauterine gestational sac, but unable to confirm definitive IUP due to lack of fetal pole.  No extrauterine masses or cyst seen.  Ultrasound appearance and hCG are consistent with very early pregnancy but not consistent with her LMP.  This is suggestive of failed pregnancy/missed abortion versus miscarriage in process.  Recommend she keep her appointment with [facility] for recheck of hCG next week and further recommendations.  No evidence of infection or internal hemorrhage      ____________________________________________   FINAL CLINICAL IMPRESSION(S) / ED DIAGNOSES    Final diagnoses:  Threatened miscarriage  Vaginal bleeding in pregnancy     ED Discharge Orders    None      Portions of this note were generated with dragon dictation software. Dictation errors may occur despite best attempts at proofreading.   Sharman Cheek, MD 01/11/21 2032

## 2021-01-16 ENCOUNTER — Encounter: Payer: Self-pay | Admitting: Obstetrics & Gynecology

## 2021-01-16 ENCOUNTER — Ambulatory Visit (INDEPENDENT_AMBULATORY_CARE_PROVIDER_SITE_OTHER): Payer: Medicaid Other | Admitting: Obstetrics & Gynecology

## 2021-01-16 ENCOUNTER — Other Ambulatory Visit: Payer: Self-pay

## 2021-01-16 ENCOUNTER — Ambulatory Visit (INDEPENDENT_AMBULATORY_CARE_PROVIDER_SITE_OTHER): Payer: Medicaid Other

## 2021-01-16 VITALS — BP 120/80 | Wt 199.0 lb

## 2021-01-16 DIAGNOSIS — N926 Irregular menstruation, unspecified: Secondary | ICD-10-CM

## 2021-01-16 DIAGNOSIS — O209 Hemorrhage in early pregnancy, unspecified: Secondary | ICD-10-CM

## 2021-01-16 NOTE — Patient Instructions (Signed)
Miscarriage A miscarriage is the loss of pregnancy before the 20th week. Most miscarriages happen during the first 3 months of pregnancy. Sometimes, a miscarriage can happen before a woman knows that she is pregnant. Having a miscarriage can be an emotional experience. If you have had a miscarriage, talk with your health care provider about any questions you may have about the loss of your baby, the grieving process, and your plans for future pregnancy. What are the causes? Many times, the cause of a miscarriage is not known. What increases the risk? The following factors may make a pregnant woman more likely to have a miscarriage: Certain medical conditions  Conditions that affect the hormone balance in the body, such as thyroid disease or polycystic ovary syndrome.  Diabetes.  Autoimmune disorders.  Infections.  Bleeding disorders.  Obesity. Lifestyle factors  Using products with tobacco or nicotine in them or being exposed to tobacco smoke.  Having alcohol.  Having large amounts of caffeine.  Recreational drug use. Problems with reproductive organs or structures  Cervical insufficiency. This is when the lowest part of the uterus (cervix) opens and thins before pregnancy is at term.  Having a condition called Asherman syndrome. This syndrome causes scarring in the uterus or causes the uterus to be abnormal in structure.  Fibrous growths, called fibroids, in the uterus.  Congenital abnormalities. These problems are present at birth.  Infection of the cervix or uterus. Personal or medical history  Injury (trauma).  Having had a miscarriage before.  Being younger than age 18 or older than age 35.  Exposure to harmful substances in the environment. This may include radiation or heavy metals, such as lead.  Use of certain medicines. What are the signs or symptoms? Symptoms of this condition include:  Vaginal bleeding or spotting, with or without cramps or  pain.  Pain or cramping in the abdomen or lower back.  Fluid or tissue coming out of the vagina. How is this diagnosed? This condition may be diagnosed based on:  A physical exam.  Ultrasound.  Lab tests, such as blood tests, urine tests, or swabs for infection. How is this treated? Treatment for a miscarriage is sometimes not needed if all the pregnancy tissue that was in the uterus comes out on its own, and there are no other problems such as infection or heavy bleeding. In other cases, this condition may be treated with:  Dilation and curettage (D&C). In this procedure, the cervix is stretched open and any remaining pregnancy tissue is removed from the lining of the uterus (endometrium).  Medicines. These may include: ? Antibiotic medicine, to treat infection. ? Medicine to help any remaining pregnancy tissue come out of the body. ? Medicine to reduce (contract) the size of the uterus. These medicines may be given if there is a lot of bleeding. If you have Rh-negative blood, you may be given an injection of a medicine called Rho(D) immune globulin. This medicine helps prevent problems with future pregnancies. Follow these instructions at home: Medicines  Take over-the-counter and prescription medicines only as told by your health care provider.  If you were prescribed antibiotic medicine, take it as told by your health care provider. Do not stop taking the antibiotic even if you start to feel better. Activity  Rest as told by your health care provider. Ask your health care provider what activities are safe for you.  Have someone help with home and family responsibilities during this time. General instructions  Monitor how much tissue   or blood clot material comes out of the vagina.  Do not have sex, douche, or put anything, such as tampons, in your vagina until your health care provider says it is okay.  To help you and your partner with the grieving process, talk with your  health care provider or get counseling.  When you are ready, meet with your health care provider to discuss any important steps you should take for your health. Also, discuss steps you should take to have a healthy pregnancy in the future.  Keep all follow-up visits. This is important.   Where to find more information  The American College of Obstetricians and Gynecologists: acog.org  [place] Department of Health and Human Services Office of Women's Health: hrsa.gov/office-womens-health Contact a health care provider if:  You have a fever or chills.  There is bad-smelling fluid coming from the vagina.  You have more bleeding instead of less.  Tissue or blood clots come out of your vagina. Get help right away if:  You have severe cramps or pain in your back or abdomen.  Heavy bleeding soaks through 2 large sanitary pads an hour for more than 2 hours.  You become light-headed or weak.  You faint.  You feel sad, and your sadness takes over your thoughts.  You think about hurting yourself. If you ever feel like you may hurt yourself or others, or have thoughts about taking your own life, get help right away. Go to your nearest emergency department or:  Call your local emergency services (911 in the [place]  Call a suicide crisis helpline, such as the National Suicide Prevention Lifeline at 1-800-273-8255. This is open 24 hours a day in the [place]  Text the Crisis Text Line at 741741 (in the [place] Summary  Most miscarriages happen in the first 3 months of pregnancy. Sometimes miscarriage happens before a woman knows that she is pregnant.  Follow instructions from your health care provider about medicines and activity.  To help you and your partner with grieving, talk with your health care provider or get counseling.  Keep all follow-up visits. This information is not intended to replace advice given to you by your health care provider. Make sure you discuss any questions you  have with your health care provider. Document Revised: 04/27/2020 Document Reviewed: 04/27/2020 Elsevier Patient Education  2021 Elsevier Inc.  

## 2021-01-16 NOTE — Progress Notes (Signed)
Obstetric Problem Visit   Chief Complaint: First trimester bleeding  History of Present Illness: Patient is a 30 y.o. [id number] [redacted]w[redacted]d presenting for first trimester bleeding.  The onset of bleeding was last Friday (6 days ago) and she was seen in ER with beta and US done there.  She has continued to bleed, light most days.  Heaviest day was yesterday.  Mild cramping.  Is bleeding equal to or greater than normal menstrual flow:  No Any recent trauma:  No Recent intercourse:  No History of prior miscarriage:  Yes Prior ultrasound demonstrating IUP:  Yes Prior ultrasound demonstrating viable IUP:  No Prior Serum HCG:  Yes 710 on 01/11/2021 Rh status: A+  Prior SAB 2020 and 2005. Prior NSVD x2. Desires future pregnancy  PMHx: She  has a past medical history of Bacterial vaginosis and Obesity (BMI 35.0-39.9 without comorbidity). Also,  has a past surgical history that includes Cholecystectomy and Dilation and evacuation (2005)., family history includes Breast cancer (age of onset: 29) in her maternal grandmother; Cancer in her maternal grandmother; Cancer (age of onset: 53) in her mother; Diabetes in her maternal grandmother and mother; Heart attack in her maternal grandfather; Hypertension in her mother; Thyroid disease in her mother.,  reports that she quit smoking about 3 months ago. Her smoking use included cigarettes. She has never used smokeless tobacco. She reports previous alcohol use. She reports previous drug use.  She has a current medication list which includes the following prescription(s): multivitamin-prenatal. Also, is allergic to penicillins and sulfa antibiotics.  Review of Systems  Constitutional: Negative for chills, fever and malaise/fatigue.  HENT: Negative for congestion, sinus pain and sore throat.   Eyes: Negative for blurred vision and pain.  Respiratory: Negative for cough and wheezing.   Cardiovascular: Negative for chest pain and leg swelling.  Gastrointestinal:  Negative for abdominal pain, constipation, diarrhea, heartburn, nausea and vomiting.  Genitourinary: Negative for dysuria, frequency, hematuria and urgency.  Musculoskeletal: Negative for back pain, joint pain, myalgias and neck pain.  Skin: Negative for itching and rash.  Neurological: Negative for dizziness, tremors and weakness.  Endo/Heme/Allergies: Does not bruise/bleed easily.  Psychiatric/Behavioral: Negative for depression. The patient is not nervous/anxious and does not have insomnia.   All other systems reviewed and are negative.   Objective: Vitals:   01/16/21 1400  BP: 120/80   Physical Exam Constitutional:      General: She is not in acute distress.    Appearance: She is well-developed and well-nourished.  Musculoskeletal:        General: Normal range of motion.  Neurological:     Mental Status: She is alert and oriented to person, place, and time.  Skin:    General: Skin is warm and dry.  Psychiatric:        Mood and Affect: Mood and affect normal.  Vitals reviewed.     Assessment: 30 y.o. [id number] 1. First trimester bleeding - Beta hCG quant (ref lab) - Likely completed miscarriage          Condolences were offered to the patient and her family.  I stressed that while emotionally difficult, that this did not occur because of an actions or inactions by the patient.  Somewhere between 10-20% of identified first trimester pregnancies will unfortunately end in miscarriage.  Given this relatively high incidence rate, further diagnostic testing such as chromosome analysis is generally not clinically relevant nor recommended.  Although the chromosomal abnormalities have been implicated at rates as high as 70%  in some studies, these are generally random and do not infer and increased risk of recurrence with subsequent pregnancies.  However, 3 or more consecutive first trimester losses are relatively uncommon, and these patient generally do benefit from additional work up to  determine a potential modifiable etiology.              We briefly discussed management options including expectant management, medical management, and surgical management as well as their relative success rates and complications. Approximately 80% of first trimester miscarriages will pass successfully but may require a time frame of up to 8 weeks (ACOG Practice Bulletin 150 May 2015 "Early Pregnancy Loss").  Medical management has literature supporting its use up to 63 days or [redacted]w[redacted]d gestation and results in a passage rate of 84-85% Environmental education officer Bulletin 143 March 2014 "Medical Management of First-Trimester Abortion").  Dilation and curettage has the highest rate of uterine evacuation, but carries with is operative cost, surgical and anesthetic risk.  While these risk are relatively small they nevertheless include infection, bleeding, uterine perforation, formation of uterine synechia, and in rare cases death.              We discussed repeat ultrasound and or trending HCG levels if the patient wishes to pursue these prior to making her decision.  Clinically I am confident of the diagnosis, but I do not want any doubts in the patient's mind regarding the plan of management she chooses to adopt.  The patient is Rh POS, rhogam is therefore not indicated to decrease the risk rhesus alloimmunization.    Routine bleeding precautions were discussed with the patient prior the conclusion of today's visit.  Plan awaiting normal cycles, then try for pregnancy again soon  Annamarie Major, MD, [facility], [facility] 01/16/2021  2:22 PM

## 2021-01-17 LAB — URINE DRUG PANEL 7
Amphetamines, Urine: NEGATIVE ng/mL
Barbiturate Quant, Ur: NEGATIVE ng/mL
Benzodiazepine Quant, Ur: NEGATIVE ng/mL
Cannabinoid Quant, Ur: POSITIVE — AB
Cocaine (Metab.): NEGATIVE ng/mL
Opiate Quant, Ur: NEGATIVE ng/mL
PCP Quant, Ur: NEGATIVE ng/mL

## 2021-01-17 LAB — BETA HCG QUANT (REF LAB): hCG Quant: 16 m[IU]/mL

## 2021-03-05 ENCOUNTER — Other Ambulatory Visit: Payer: Self-pay | Admitting: Obstetrics & Gynecology

## 2021-03-05 DIAGNOSIS — O209 Hemorrhage in early pregnancy, unspecified: Secondary | ICD-10-CM

## 2021-06-19 ENCOUNTER — Other Ambulatory Visit: Payer: Self-pay

## 2021-06-19 ENCOUNTER — Ambulatory Visit (LOCAL_COMMUNITY_HEALTH_CENTER): Payer: Medicaid Other

## 2021-06-19 VITALS — BP 127/79 | Ht 62.0 in | Wt 209.5 lb

## 2021-06-19 DIAGNOSIS — Z3202 Encounter for pregnancy test, result negative: Secondary | ICD-10-CM

## 2021-06-19 LAB — PREGNANCY, URINE: Preg Test, Ur: NEGATIVE

## 2021-06-19 NOTE — Progress Notes (Signed)
UPT negative. LMP 04/18/2021. Wants to be preg. And has nausea, increased appetite, breast tenderness. Reports increased stress. RN recommended pt to see PCP Phineas Real) for evaluation. Pt states she has physical scheduled at [facility] for 06/24/2021. Jerel Shepherd, RN

## 2021-06-24 ENCOUNTER — Ambulatory Visit: Payer: Medicaid Other

## 2021-08-02 ENCOUNTER — Other Ambulatory Visit: Payer: Self-pay

## 2021-08-02 ENCOUNTER — Encounter: Payer: Self-pay | Admitting: Physician Assistant

## 2021-08-02 ENCOUNTER — Ambulatory Visit: Payer: Medicaid Other | Admitting: Physician Assistant

## 2021-08-02 DIAGNOSIS — Z113 Encounter for screening for infections with a predominantly sexual mode of transmission: Secondary | ICD-10-CM

## 2021-08-02 DIAGNOSIS — N760 Acute vaginitis: Secondary | ICD-10-CM

## 2021-08-02 DIAGNOSIS — B9689 Other specified bacterial agents as the cause of diseases classified elsewhere: Secondary | ICD-10-CM

## 2021-08-02 DIAGNOSIS — B373 Candidiasis of vulva and vagina: Secondary | ICD-10-CM

## 2021-08-02 DIAGNOSIS — B3731 Acute candidiasis of vulva and vagina: Secondary | ICD-10-CM

## 2021-08-02 LAB — WET PREP FOR TRICH, YEAST, CLUE: Trichomonas Exam: NEGATIVE

## 2021-08-02 MED ORDER — CLOTRIMAZOLE 1 % VA CREA: 1.0000 | TOPICAL_CREAM | Freq: Every day | VAGINAL | 0 refills | Status: DC

## 2021-08-02 MED ORDER — METRONIDAZOLE 500 MG PO TABS: 500.0000 mg | ORAL_TABLET | Freq: Two times a day (BID) | ORAL | 0 refills | Status: AC

## 2021-08-02 NOTE — Progress Notes (Signed)
[facility] Department STI clinic/screening visit  Subjective:  Anne Cochran is a 30 y.o. female being seen today for an STI screening visit. The patient reports they do have symptoms.  Patient reports that they do not desire a pregnancy in the next year.   They reported they are not interested in discussing contraception today.  Patient's last menstrual period was 07/22/2021 (approximate).   Patient has the following medical conditions:   Patient Active Problem List   Diagnosis Date Noted   Maternal obesity, antepartum, second trimester 01/07/2021   Supervision of other normal pregnancy, antepartum 01/07/2021   Smoker 0-4 cpd 09/16/2019    Chief Complaint  Patient presents with   SEXUALLY TRANSMITTED DISEASE    Screening    HPI  Patient reports that she has had a gray discharge with "foul smell" for about 1 month.  Denies other symptoms and chronic conditions.  States LMP was 07/22/2021 and is using condoms sometimes as her BCM. Reports her last HIV test was in 2021 and last pap was in February of this year.    See flowsheet for further details and programmatic requirements.    The following portions of the patient's history were reviewed and updated as appropriate: allergies, current medications, past medical history, past social history, past surgical history and problem list.  Objective:  There were no vitals filed for this visit.  Physical Exam Constitutional:      General: She is not in acute distress.    Appearance: Normal appearance.  HENT:     Head: Normocephalic and atraumatic.     Comments: No nits,lice, or hair loss. No cervical, supraclavicular or axillary adenopathy.     Mouth/Throat:     Mouth: Mucous membranes are moist.     Pharynx: Oropharynx is clear. No oropharyngeal exudate or posterior oropharyngeal erythema.  Eyes:     Conjunctiva/sclera: Conjunctivae normal.  Pulmonary:     Effort: Pulmonary effort is normal.  Abdominal:      Palpations: Abdomen is soft. There is no mass.     Tenderness: There is no abdominal tenderness. There is no guarding or rebound.  Genitourinary:    General: Normal vulva.     Rectum: Normal.     Comments: External genitalia/pubic area without nits, lice, edema, erythema, lesions and inguinal adenopathy. Vagina with normal mucosa and moderate amount of thin, grayish discharge, pH=>4.5. Cervix without visible lesions. Uterus firm, mobile, nt, no masses, no CMT, no adnexal tenderness or fullness.  Musculoskeletal:     Cervical back: Neck supple. No tenderness.  Skin:    General: Skin is warm and dry.     Findings: No bruising, erythema, lesion or rash.  Neurological:     Mental Status: She is alert and oriented to person, place, and time.  Psychiatric:        Mood and Affect: Mood normal.        Behavior: Behavior normal.        Thought Content: Thought content normal.        Judgment: Judgment normal.     Assessment and Plan:  Anne Cochran is a 30 y.o. female presenting to the [facility] Department for STI screening  1. Screening for STD (sexually transmitted disease) Patient into clinic with symptoms. Rec condoms with all sex. Await test results.  Counseled that RN will call if needs to RTC for treatment once results are back.  - WET PREP FOR TRICH, YEAST, CLUE - Gonococcus culture - Chlamydia/Gonorrhea [place]  Lab - [facility] - Syphilis Serology, [facility]  2. BV (bacterial vaginosis) Treat BV with Metronidazole 500 mg #14 1 po BID for 7 days with food, no EtOH for 24 hr before and until 72 hr after completing medicine. No sex for 10 days. Enc to use OTC antifungal cream if has itching during or just after antibiotic use.  - metroNIDAZOLE (FLAGYL) 500 MG tablet; Take 1 tablet (500 mg total) by mouth 2 (two) times daily for 7 days.  Dispense: 14 tablet; Refill: 0  3. Candidiasis of vulva and vagina Treat yeast with Clotrimazole 1% vaginal cream 1  app qhs for 7 days. - clotrimazole (CLOTRIMAZOLE-7) 1 % vaginal cream; Place 1 Applicatorful vaginally at bedtime.  Dispense: 45 g; Refill: 0     No follow-ups on file.  No future appointments.  Matt Holmes, PA

## 2021-08-02 NOTE — Progress Notes (Signed)
Pt here for STD screening.  Wet mount results reviewed.  Medication dispensed per Provider orders.  Condoms given.  Zoella Roberti M Hoover Grewe, RN ? ?

## 2021-08-06 LAB — GONOCOCCUS CULTURE

## 2023-07-15 LAB — OB RESULTS CONSOLE HEPATITIS B SURFACE ANTIGEN: Hepatitis B Surface Ag: NEGATIVE

## 2023-07-15 LAB — OB RESULTS CONSOLE VARICELLA ZOSTER ANTIBODY, IGG: Varicella: IMMUNE

## 2023-07-15 LAB — OB RESULTS CONSOLE RUBELLA ANTIBODY, IGM: Rubella: IMMUNE

## 2023-08-12 LAB — OB RESULTS CONSOLE GC/CHLAMYDIA
Chlamydia: NEGATIVE
Neisseria Gonorrhea: NEGATIVE

## 2023-11-11 NOTE — L&D Delivery Note (Signed)
 Delivery Note  Anne Cochran is a [id number] at [redacted]w[redacted]d with an LMP of 03/21/2024, consistent with US  at [redacted]w[redacted]d.   First Stage: Labor onset: 0020 on 04/29 Induction: misoprostol Augmentation: IV Pitocin  Analgesia /Anesthesia intrapartum: Epidural  SROM at 0140 GBS: positive IP Antibiotics: ceFazolin  Second Stage: Complete dilation approximately around 0257 Onset of pushing at 0257 FHR second stage 155 bpm with moderate variability, variable decels with pushing   Anne Cochran presented to L&D for IOL due to gHTN. She was induced with oral/vaginal Cytotec x2 and augmented with IV Pitocin. She progressed to 10/100/+2 with a spontaneous urge to push.  She pushed  effectively over approximately 17 minutes for a spontaneous vaginal birth.  Delivery of a viable baby girl on 03/08/2024 at 0258 by RN. CNM arrived promptly to bedside to delivery placenta.  Delivery of fetal head in LOA position with restitution to LOT, reported by RN. No nuchal cord;  Anterior then posterior shoulders delivered easily with gentle downward traction by RN. Baby placed on mom's chest, and attended to by baby RN Cord double clamped after cessation of pulsation, cut by FOB.  Cord blood sample collection: Not Indicated A POS Collection of cord blood donation: No Arterial cord blood sample: No  Third Stage: Oxytocin bolus started after delivery of infant for hemorrhage prophylaxis  Placenta delivered [name] intact with 3 VC @ 0309 Placenta disposition: Discarded  Uterine tone firm / bleeding minimal IV Pitocin for hemorrhage prophylaxis  Periurethral lacerations identified. Not repaired due to hemostasis.  Anesthesia used for repair: N/A Suture Repair: M/A Est. Blood Loss (mL): 150  Complications: None   Mom to postpartum.  Baby to Couplet care / Skin to Skin.  Newborn: Information for the patient's newborn:  Anne Cochran, [name] [[id number]]  Live born child  Birth Weight:  3040 g APGAR: 8, 9  Newborn  Delivery   Birth date/time: 03/08/2024 02:58:00 Delivery type: Vaginal, Spontaneous      Feeding planned: breast feeding  ---------- [name] CNM Certified Nurse Midwife [facility]  Clinic OB[facility]GYN [facility]

## 2023-12-18 LAB — OB RESULTS CONSOLE RPR: RPR: NONREACTIVE

## 2023-12-18 LAB — OB RESULTS CONSOLE HIV ANTIBODY (ROUTINE TESTING): HIV: NONREACTIVE

## 2024-02-19 LAB — OB RESULTS CONSOLE GBS: GBS: POSITIVE

## 2024-03-04 ENCOUNTER — Other Ambulatory Visit: Payer: Self-pay

## 2024-03-04 ENCOUNTER — Encounter: Payer: Self-pay | Admitting: Obstetrics and Gynecology

## 2024-03-04 ENCOUNTER — Observation Stay
Admission: AD | Admit: 2024-03-04 | Discharge: 2024-03-04 | Disposition: A | Discharge status: Home / Self Care | Source: Ambulatory Visit | Attending: Certified Nurse Midwife | Admitting: Certified Nurse Midwife

## 2024-03-04 DIAGNOSIS — O133 Gestational [pregnancy-induced] hypertension without significant proteinuria, third trimester: Principal | ICD-10-CM | POA: Insufficient documentation

## 2024-03-04 DIAGNOSIS — O26893 Other specified pregnancy related conditions, third trimester: Secondary | ICD-10-CM | POA: Insufficient documentation

## 2024-03-04 DIAGNOSIS — O26899 Other specified pregnancy related conditions, unspecified trimester: Secondary | ICD-10-CM | POA: Insufficient documentation

## 2024-03-04 DIAGNOSIS — Z87891 Personal history of nicotine dependence: Secondary | ICD-10-CM | POA: Diagnosis not present

## 2024-03-04 DIAGNOSIS — Z3A37 37 weeks gestation of pregnancy: Secondary | ICD-10-CM | POA: Insufficient documentation

## 2024-03-04 DIAGNOSIS — R109 Unspecified abdominal pain: Secondary | ICD-10-CM | POA: Insufficient documentation

## 2024-03-04 DIAGNOSIS — R030 Elevated blood-pressure reading, without diagnosis of hypertension: Principal | ICD-10-CM | POA: Diagnosis present

## 2024-03-04 LAB — CBC
HCT: 32.4 % — ABNORMAL LOW (ref 36.0–46.0)
Hemoglobin: 11.1 g/dL — ABNORMAL LOW (ref 12.0–15.0)
MCH: 31.4 pg (ref 26.0–34.0)
MCHC: 34.3 g/dL (ref 30.0–36.0)
MCV: 91.5 fL (ref 80.0–100.0)
Platelets: 168 10*3/uL (ref 150–400)
RBC: 3.54 MIL/uL — ABNORMAL LOW (ref 3.87–5.11)
RDW: 14.4 % (ref 11.5–15.5)
WBC: 7.6 10*3/uL (ref 4.0–10.5)
nRBC: 0 % (ref 0.0–0.2)

## 2024-03-04 LAB — COMPREHENSIVE METABOLIC PANEL WITH GFR
ALT: 21 U/L (ref 0–44)
AST: 19 U/L (ref 15–41)
Albumin: 2.8 g/dL — ABNORMAL LOW (ref 3.5–5.0)
Alkaline Phosphatase: 99 U/L (ref 38–126)
Anion gap: 6 (ref 5–15)
BUN: 11 mg/dL (ref 6–20)
CO2: 21 mmol/L — ABNORMAL LOW (ref 22–32)
Calcium: 8.2 mg/dL — ABNORMAL LOW (ref 8.9–10.3)
Chloride: 108 mmol/L (ref 98–111)
Creatinine, Ser: 0.57 mg/dL (ref 0.44–1.00)
GFR, Estimated: >60 mL/min (ref >60)
Glucose, Bld: 71 mg/dL (ref 70–99)
Potassium: 3.7 mmol/L (ref 3.5–5.1)
Sodium: 135 mmol/L (ref 135–145)
Total Bilirubin: 0.6 mg/dL (ref 0.0–1.2)
Total Protein: 6.4 g/dL — ABNORMAL LOW (ref 6.5–8.1)

## 2024-03-04 LAB — PROTEIN / CREATININE RATIO, URINE
Creatinine, Urine: 38 mg/dL
Total Protein, Urine: <6 mg/dL

## 2024-03-04 MED ORDER — ACETAMINOPHEN 500 MG PO TABS
1000.0000 mg | ORAL_TABLET | Freq: Once | ORAL | Status: AC
  Administered 2024-03-04: 1000 mg via ORAL
  Filled 2024-03-04: qty 2

## 2024-03-04 MED ORDER — LACTATED RINGERS IV BOLUS
1000.0000 mL | Freq: Once | INTRAVENOUS | Status: AC
  Administered 2024-03-04: 1000 mL via INTRAVENOUS

## 2024-03-04 NOTE — Progress Notes (Signed)
 Discharge instructions provided to patient. Patient verbalized understanding. Pt educated on signs and symptoms of labor, vaginal bleeding, LOF, and fetal movement. Red flag signs reviewed by RN. Patient discharged home with significant other in stable condition.

## 2024-03-04 NOTE — Discharge Instructions (Signed)
 Please take blood pressure at home twice a day (in AM and in PM) and bring log for review at BP check visit.

## 2024-03-04 NOTE — Discharge Summary (Signed)
 Anne Cochran is a 33 y.o. female. She is at [redacted]w[redacted]d gestation. No LMP recorded. Patient is pregnant. Estimated Date of Delivery: 03/21/24  Prenatal care site: Anne Cochran  Chief complaint: Elevated Blood Pressure Reading   HPI: Anne Cochran presents to L&D with concerns of elevated blood pressure readings. She was [facility] in [facility] (03/04/2024) and had elevated blood pressure readings (149/87 and 146/83) so they advised her to come to L&D triage for further evaluation. She reports she has a headache last night that was relieved with OTC Tylenol . She reports the headache returned and she did not take any additional Tylenol . She denies current headaches, visual disturbances, RUQ pain, and swelling to BLE extremities. Reports active fetal movement. Denies vaginal bleeding and leakage of fluid.   Factors complicating pregnancy: Obesity  S: Resting comfortably. No vaginal bleeding and no leakage of fluid. Endorses active fetal movement.   Maternal Medical History:  Past Medical Hx:  has a past medical history of Bacterial vaginosis, Chlamydia, and Obesity (BMI 35.0-39.9 without comorbidity).    Past Surgical Hx:  has a past surgical history that includes Cholecystectomy and Dilation and evacuation (2005).   Allergies  Allergen Reactions   Penicillins    Sulfa Antibiotics      Prior to Admission medications   Medication Sig Start Date End Date Taking? Authorizing Provider  clotrimazole  (CLOTRIMAZOLE -7) 1 % vaginal cream Place 1 Applicatorful vaginally at bedtime. 08/02/21   Roz Cornelia, PA    Social History: She  reports that she quit smoking about 3 years ago. Her smoking use included cigarettes. She has never used smokeless tobacco. She reports that she does not currently use alcohol. She reports that she does not currently use drugs.  Family History: family history includes Breast cancer (age of onset: 11) in her maternal grandmother; Cancer in her maternal grandmother;  Cancer (age of onset: 26) in her mother; Diabetes in her maternal grandmother and mother; Heart attack in her maternal grandfather; Hypertension in her mother; Thyroid disease in her mother. ,  Review of Systems:  Review of Systems  Constitutional: Negative.   Eyes: Negative.   Cardiovascular:  Negative for leg swelling.  Gastrointestinal:  Negative for abdominal pain (Reports no RUQ pain).  Genitourinary: Negative.   Neurological:  Negative for headaches (Reports no current headaches).  Psychiatric/Behavioral: Negative.       O:  BP 134/74   Pulse (!) 59   Temp 98.3 F (36.8 C) (Oral)   Resp 16   Vitals:   03/04/24 1304 03/04/24 1321 03/04/24 1335 03/04/24 1350  BP: (!) 142/77 136/74 136/72 (!) 111/56   03/04/24 1449  BP: 134/74    Vitals:   03/04/24 1321 03/04/24 1335 03/04/24 1350 03/04/24 1449  BP: 136/74 136/72 (!) 111/56 134/74  Pulse: 63 84 60 (!) 59  Resp:      Temp:      TempSrc:         Results for orders placed or performed during the hospital encounter of 03/04/24 (from the past 48 hours)  Protein / creatinine ratio, urine   Collection Time: 03/04/24  1:09 PM  Result Value Ref Range   Creatinine, Urine 38 mg/dL   Total Protein, Urine <6 mg/dL   Protein Creatinine Ratio        0.00 - 0.15 mg/mg[Cre]  CBC   Collection Time: 03/04/24  1:09 PM  Result Value Ref Range   WBC 7.6 4.0 - 10.5 K/uL   RBC 3.54 (L) 3.87 -  5.11 MIL/uL   Hemoglobin 11.1 (L) 12.0 - 15.0 g/dL   HCT 96.2 (L) 95.2 - 84.1 %   MCV 91.5 80.0 - 100.0 fL   MCH 31.4 26.0 - 34.0 pg   MCHC 34.3 30.0 - 36.0 g/dL   RDW 32.4 40.1 - 02.7 %   Platelets 168 150 - 400 K/uL   nRBC 0.0 0.0 - 0.2 %  Comprehensive metabolic panel   Collection Time: 03/04/24  1:09 PM  Result Value Ref Range   Sodium 135 135 - 145 mmol/L   Potassium 3.7 3.5 - 5.1 mmol/L   Chloride 108 98 - 111 mmol/L   CO2 21 (L) 22 - 32 mmol/L   Glucose, Bld 71 70 - 99 mg/dL   BUN 11 6 - 20 mg/dL   Creatinine, Ser 2.53 0.44 -  1.00 mg/dL   Calcium 8.2 (L) 8.9 - 10.3 mg/dL   Total Protein 6.4 (L) 6.5 - 8.1 g/dL   Albumin 2.8 (L) 3.5 - 5.0 g/dL   AST 19 15 - 41 U/L   ALT 21 0 - 44 U/L   Alkaline Phosphatase 99 38 - 126 U/L   Total Bilirubin 0.6 0.0 - 1.2 mg/dL   GFR, Estimated >66 >44 mL/min   Anion gap 6 5 - 15     Constitutional: NAD, AAOx3  HE/ENT: extraocular movements grossly intact CV: RRR PULM: nl respiratory effort, CTABL Abd: gravid, non-tender, non-distended, soft  Ext: Non-tender, Nonedmeatous, DTR +2, clonus absent Psych: mood appropriate, speech normal Pelvic : Deferred  SVE: Dilation: 1.5 Effacement (%): Thick Cervical Position: Posterior Station: -3 Exam by:: Greeson RN   NST: Baseline: 145 bpm Variability: moderate Accels: Present Decels: none Toco: irregular, every 2-21 minutes Category: I Interpretation:  INDICATIONS: patient reassurance and rule out uterine contractions RESULTS:  A NST procedure was performed with FHR monitoring and a normal baseline established, appropriate time of 20-40 minutes of evaluation, and accels >2 seen w 15x15 characteristics.  Results show a REACTIVE NST.   Assessment: 33 y.o. [redacted]w[redacted]d here for antenatal surveillance during pregnancy.  Principle diagnosis: Elevated Blood Pressure Reading and Cramping affecting pregnancy, antepartum   Plan: Antenatal Surveillance  Labor: Not present. US  and toco placed on patient abdomen. FHR tracing observed for approximately 2.5 hours. Patient reports feeling mild intermittent cramping/tightening of abdomen to not feeling contractions at all after LR bolus. Abdomen soft/nontender to palpation. Irregular contractions (q2-21 mins) observed on FHR tracing. SVE: 0.5/Thick/-3. Cervical exam unchanged after 1 hour reassuring against labor.  Fetal Wellbeing: Reassuring Cat 1 tracing. Reactive NST   2. Elevated Blood Pressure Readings PIH labs collected (03/04/2024); Results: WNL   - P/C ratio: <6   - CBC; Results as  follows:    - WBC: 7.6    - Hgb: 11.1    - Platelets: 168   - CMP; Results as follows:    - AST/ALT: 19/21    - Creatinine, Ser: 0.57 Denies Pre-E signs/symptoms Blood pressures cycled q15-30 mins  - Range from normotensive to MRBP (142-111/56-74) Signs and symptoms of Pre-E reviewed. Patient advised to take and log BP at home BID (AM and PM).   - Patient and partner states they can get access to a blood pressure kit                      in order to take blood pressure at home.  Follow-up with Anne Cochran for BP check in approximately 5 days and bring BP log  for review at visit. Patient verbalized understanding.   3. Cramping affecting pregnancy, antepartum  Tylenol  1,000 mg PO given.  LR Bolus 1,000 mL given.  Irregular contractions (2-21 mins) observed on FHR tracing. Patient reports feeling mild cramping/tightening in abdomen to not feeling contractions at all after LR bolus. Abdomen soft/nontender to palpation. Patient reports she has not drank much water today. Patient encouraged to increase fluid intake to maintain adequate hydration.   - D/c home stable, strict return precautions reviewed, follow-up as nedeed.   ----- Ranay Ketter, CNM Certified Nurse Midwife [place]  [facility] OB/[facility]N [facility]

## 2024-03-04 NOTE — OB Triage Note (Signed)
 Patient is a 33 yo, G6P2, at 37 weeks 4 days. Patient presents with complaints of elevated BP. [facility] and had Bps of 149/87 and 146/83. Patient states she had a headache last night that was relieved with tylenol  but came back this morning and she has not taken anymore tylenol . Patient denies any vision changes or RUQ pain. Reflexes +2, clonus absent. Patient denies any vaginal bleeding or LOF. Patient reports +FM. Monitors applied and assessing. VSS. Initial fetal heart tone 150. [place] CNM notified of patients arrival to unit. Plan to place in observation for pre-e workup and NST.

## 2024-03-07 ENCOUNTER — Other Ambulatory Visit: Payer: Self-pay

## 2024-03-07 ENCOUNTER — Inpatient Hospital Stay
Admission: AD | Admit: 2024-03-07 | Discharge: 2024-03-09 | DRG: 807 | Disposition: A | Discharge status: Home / Self Care | Source: Ambulatory Visit

## 2024-03-07 ENCOUNTER — Inpatient Hospital Stay: Admitting: Anesthesiology

## 2024-03-07 ENCOUNTER — Encounter: Payer: Self-pay | Admitting: Obstetrics and Gynecology

## 2024-03-07 DIAGNOSIS — O9902 Anemia complicating childbirth: Secondary | ICD-10-CM | POA: Diagnosis present

## 2024-03-07 DIAGNOSIS — O99019 Anemia complicating pregnancy, unspecified trimester: Secondary | ICD-10-CM | POA: Diagnosis present

## 2024-03-07 DIAGNOSIS — Z8249 Family history of ischemic heart disease and other diseases of the circulatory system: Secondary | ICD-10-CM

## 2024-03-07 DIAGNOSIS — O99210 Obesity complicating pregnancy, unspecified trimester: Secondary | ICD-10-CM | POA: Diagnosis present

## 2024-03-07 DIAGNOSIS — Z833 Family history of diabetes mellitus: Secondary | ICD-10-CM

## 2024-03-07 DIAGNOSIS — Z87891 Personal history of nicotine dependence: Secondary | ICD-10-CM

## 2024-03-07 DIAGNOSIS — Z7982 Long term (current) use of aspirin: Secondary | ICD-10-CM | POA: Diagnosis not present

## 2024-03-07 DIAGNOSIS — O99824 Streptococcus B carrier state complicating childbirth: Secondary | ICD-10-CM | POA: Diagnosis present

## 2024-03-07 DIAGNOSIS — O99214 Obesity complicating childbirth: Secondary | ICD-10-CM | POA: Diagnosis present

## 2024-03-07 DIAGNOSIS — Z880 Allergy status to penicillin: Secondary | ICD-10-CM

## 2024-03-07 DIAGNOSIS — O139 Gestational [pregnancy-induced] hypertension without significant proteinuria, unspecified trimester: Secondary | ICD-10-CM | POA: Diagnosis present

## 2024-03-07 DIAGNOSIS — E66813 Obesity, class 3: Secondary | ICD-10-CM | POA: Diagnosis present

## 2024-03-07 DIAGNOSIS — O134 Gestational [pregnancy-induced] hypertension without significant proteinuria, complicating childbirth: Secondary | ICD-10-CM | POA: Diagnosis present

## 2024-03-07 DIAGNOSIS — Z3A38 38 weeks gestation of pregnancy: Secondary | ICD-10-CM | POA: Diagnosis not present

## 2024-03-07 DIAGNOSIS — O26899 Other specified pregnancy related conditions, unspecified trimester: Principal | ICD-10-CM

## 2024-03-07 LAB — PROTEIN / CREATININE RATIO, URINE
Creatinine, Urine: 40 mg/dL
Total Protein, Urine: <6 mg/dL

## 2024-03-07 LAB — TYPE AND SCREEN
ABO/RH(D): A POS
Antibody Screen: NEGATIVE

## 2024-03-07 LAB — CBC
HCT: 31.8 % — ABNORMAL LOW (ref 36.0–46.0)
Hemoglobin: 10.9 g/dL — ABNORMAL LOW (ref 12.0–15.0)
MCH: 31.4 pg (ref 26.0–34.0)
MCHC: 34.3 g/dL (ref 30.0–36.0)
MCV: 91.6 fL (ref 80.0–100.0)
Platelets: 168 10*3/uL (ref 150–400)
RBC: 3.47 MIL/uL — ABNORMAL LOW (ref 3.87–5.11)
RDW: 14.6 % (ref 11.5–15.5)
WBC: 8.3 10*3/uL (ref 4.0–10.5)
nRBC: 0 % (ref 0.0–0.2)

## 2024-03-07 LAB — COMPREHENSIVE METABOLIC PANEL WITH GFR
ALT: 17 U/L (ref 0–44)
AST: 15 U/L (ref 15–41)
Albumin: 2.8 g/dL — ABNORMAL LOW (ref 3.5–5.0)
Alkaline Phosphatase: 94 U/L (ref 38–126)
Anion gap: 8 (ref 5–15)
BUN: 12 mg/dL (ref 6–20)
CO2: 21 mmol/L — ABNORMAL LOW (ref 22–32)
Calcium: 8.7 mg/dL — ABNORMAL LOW (ref 8.9–10.3)
Chloride: 106 mmol/L (ref 98–111)
Creatinine, Ser: 0.53 mg/dL (ref 0.44–1.00)
GFR, Estimated: >60 mL/min (ref >60)
Glucose, Bld: 79 mg/dL (ref 70–99)
Potassium: 3.6 mmol/L (ref 3.5–5.1)
Sodium: 135 mmol/L (ref 135–145)
Total Bilirubin: 0.6 mg/dL (ref 0.0–1.2)
Total Protein: 6.4 g/dL — ABNORMAL LOW (ref 6.5–8.1)

## 2024-03-07 MED ORDER — SODIUM CHLORIDE 0.9% FLUSH: 3.0000 mL | INTRAVENOUS | Status: DC | PRN

## 2024-03-07 MED ORDER — MISOPROSTOL 25 MCG QUARTER TABLET
25.0000 ug | ORAL_TABLET | Freq: Once | ORAL | Status: AC
  Administered 2024-03-07: 25 ug via ORAL

## 2024-03-07 MED ORDER — CEFAZOLIN SODIUM-DEXTROSE 1-4 GM/50ML-% IV SOLN: 1.0000 g | Freq: Three times a day (TID) | INTRAVENOUS | Status: DC

## 2024-03-07 MED ORDER — FENTANYL-BUPIVACAINE-NACL 0.5-0.125-0.9 MG/250ML-% EP SOLN
EPIDURAL | Status: AC
Start: 2024-03-07 — End: 2024-03-08
  Filled 2024-03-07: qty 250

## 2024-03-07 MED ORDER — ACETAMINOPHEN 500 MG PO TABS: 1000.0000 mg | ORAL_TABLET | Freq: Four times a day (QID) | ORAL | Status: DC | PRN

## 2024-03-07 MED ORDER — LIDOCAINE HCL (PF) 1 % IJ SOLN: 30.0000 mL | INTRAMUSCULAR | Status: DC | PRN

## 2024-03-07 MED ORDER — CEFAZOLIN SODIUM-DEXTROSE 2-4 GM/100ML-% IV SOLN: 2.0000 g | Freq: Once | INTRAVENOUS | Status: DC

## 2024-03-07 MED ORDER — CEFAZOLIN SODIUM-DEXTROSE 2-4 GM/100ML-% IV SOLN
2.0000 g | Freq: Once | INTRAVENOUS | Status: AC
  Administered 2024-03-07: 2 g via INTRAVENOUS
  Filled 2024-03-07: qty 100

## 2024-03-07 MED ORDER — LIDOCAINE HCL (PF) 1 % IJ SOLN
INTRAMUSCULAR | Status: AC
  Filled 2024-03-07: qty 30

## 2024-03-07 MED ORDER — FENTANYL CITRATE (PF) 100 MCG/2ML IJ SOLN: 50.0000 ug | INTRAMUSCULAR | Status: DC | PRN

## 2024-03-07 MED ORDER — LABETALOL HCL 5 MG/ML IV SOLN: 20.0000 mg | INTRAVENOUS | Status: DC | PRN

## 2024-03-07 MED ORDER — AMMONIA AROMATIC IN INHA
RESPIRATORY_TRACT | Status: AC
  Filled 2024-03-07: qty 10

## 2024-03-07 MED ORDER — MISOPROSTOL 25 MCG QUARTER TABLET
25.0000 ug | ORAL_TABLET | ORAL | Status: DC
  Administered 2024-03-07 (×2): 25 ug via VAGINAL
  Filled 2024-03-07 (×3): qty 1

## 2024-03-07 MED ORDER — ONDANSETRON HCL 4 MG/2ML IJ SOLN: 4.0000 mg | Freq: Four times a day (QID) | INTRAMUSCULAR | Status: DC | PRN

## 2024-03-07 MED ORDER — LABETALOL HCL 5 MG/ML IV SOLN: 40.0000 mg | INTRAVENOUS | Status: DC | PRN

## 2024-03-07 MED ORDER — LACTATED RINGERS IV SOLN: INTRAVENOUS | Status: DC

## 2024-03-07 MED ORDER — SODIUM CHLORIDE 0.9 % IV SOLN: 250.0000 mL | INTRAVENOUS | Status: DC | PRN

## 2024-03-07 MED ORDER — OXYTOCIN-SODIUM CHLORIDE 30-0.9 UT/500ML-% IV SOLN
2.5000 [IU]/h | INTRAVENOUS | Status: DC
  Filled 2024-03-07: qty 500

## 2024-03-07 MED ORDER — SOD CITRATE-CITRIC ACID 500-334 MG/5ML PO SOLN: 30.0000 mL | ORAL | Status: DC | PRN

## 2024-03-07 MED ORDER — OXYTOCIN 10 UNIT/ML IJ SOLN
INTRAMUSCULAR | Status: AC
  Filled 2024-03-07: qty 2

## 2024-03-07 MED ORDER — SODIUM CHLORIDE 0.9% FLUSH: 3.0000 mL | Freq: Two times a day (BID) | INTRAVENOUS | Status: DC

## 2024-03-07 MED ORDER — OXYTOCIN-SODIUM CHLORIDE 30-0.9 UT/500ML-% IV SOLN
1.0000 m[IU]/min | INTRAVENOUS | Status: DC
  Administered 2024-03-07: 2 m[IU]/min via INTRAVENOUS
  Filled 2024-03-07: qty 500

## 2024-03-07 MED ORDER — LABETALOL HCL 5 MG/ML IV SOLN
80.0000 mg | INTRAVENOUS | Status: DC | PRN
Start: 2024-03-07 — End: 2024-03-08

## 2024-03-07 MED ORDER — LACTATED RINGERS IV SOLN: 500.0000 mL | INTRAVENOUS | Status: DC | PRN

## 2024-03-07 MED ORDER — MISOPROSTOL 200 MCG PO TABS
ORAL_TABLET | ORAL | Status: DC
Start: 2024-03-07 — End: 2024-03-08
  Filled 2024-03-07: qty 4

## 2024-03-07 MED ORDER — TERBUTALINE SULFATE 1 MG/ML IJ SOLN: 0.2500 mg | Freq: Once | INTRAMUSCULAR | Status: DC | PRN

## 2024-03-07 MED ORDER — MISOPROSTOL 25 MCG QUARTER TABLET
25.0000 ug | ORAL_TABLET | Freq: Once | ORAL | Status: AC
  Administered 2024-03-07: 25 ug via ORAL
  Filled 2024-03-07: qty 1

## 2024-03-07 MED ORDER — OXYTOCIN BOLUS FROM INFUSION
333.0000 mL | Freq: Once | INTRAVENOUS | Status: AC
  Administered 2024-03-08: 333 mL via INTRAVENOUS

## 2024-03-07 MED ORDER — HYDRALAZINE HCL 20 MG/ML IJ SOLN: 10.0000 mg | INTRAMUSCULAR | Status: DC | PRN

## 2024-03-07 NOTE — H&P (Cosign Needed Addendum)
 OB History & Physical   History of Present Illness:   Chief Complaint: IOL d/t gHTN  HPI:  Anne Cochran is a 33 y.o. [id number] female at [redacted]w[redacted]d,LMP is 02/13/2019. Patient is pregnant, consistent with US  at [redacted]w[redacted]d, with Estimated Date of Delivery: 03/21/24. In [facility], she had a MRBP (155/91) today and on 04/25 in L&D triage she has a MRBP (142/77), which meets criteria for gHTN. She presents to L&D for IOL due to gHTN.   Reports active fetal movement  Contractions: irregular cramping  LOF/SROM: Intact Vaginal bleeding: None   Factors complicating pregnancy:  Obesity- Pre-pregnancy BMI 41 H/o PreDM gHTN  Patient Active Problem List   Diagnosis Date Noted   Gestational hypertension 03/07/2024   Elevated blood pressure reading 03/04/2024   Cramping affecting pregnancy, antepartum 03/04/2024   Maternal obesity, antepartum, second trimester 01/07/2021   Supervision of other normal pregnancy, antepartum 01/07/2021   Smoker 0-4 cpd 09/16/2019    Prenatal Transfer Tool  Maternal Diabetes: No Genetic Screening: Normal Maternal Ultrasounds/Referrals: Normal Fetal Ultrasounds or other Referrals:  None Maternal Substance Abuse:  No Significant Maternal Medications:  None Significant Maternal Lab Results: Group B Strep positive  Maternal Medical History:   Past Medical History:  Diagnosis Date   Bacterial vaginosis    Chlamydia    Obesity (BMI 35.0-39.9 without comorbidity)     Past Surgical History:  Procedure Laterality Date   CHOLECYSTECTOMY     DILATION AND EVACUATION  2005   incomplete AB    Allergies  Allergen Reactions   Penicillins    Sulfa Antibiotics     Prior to Admission medications   Medication Sig Start Date End Date Taking? Authorizing Provider  aspirin EC 81 MG tablet Take 81 mg by mouth daily. Swallow whole.   Yes [provider]   [facility]  OB History  Gravida Para Term Preterm AB Living  6 2 2  0 3 2   SAB IAB Ectopic Multiple Live Births  3 0 0 0 2    # Outcome Date GA Lbr Len/2nd Weight Sex Type Anes PTL Lv  6 Current           5 SAB 2022          4 SAB 2020          3 Term 11/22/11 [redacted]w[redacted]d  3118 g M Vag-Spont   LIV     Name: ALEX  2 Term 08/13/06 [redacted]w[redacted]d  3572 g Melven Stable Vag-Spont   LIV     Name: JOSE  1 SAB 2006             Social History: She  reports that she quit smoking about 3 years ago. Her smoking use included cigarettes. She has never used smokeless tobacco. She reports that she does not currently use alcohol. She reports that she does not currently use drugs.  Family History: family history includes Breast cancer (age of onset: 15) in her maternal grandmother; Cancer in her maternal grandmother; Cancer (age of onset: 54) in her mother; Diabetes in her maternal grandmother and mother; Heart attack in her maternal grandfather; Hypertension in her mother; Thyroid disease in her mother.   Review of Systems:  Review of Systems  Constitutional: Negative.   Gastrointestinal: Negative.   Genitourinary: Negative.   Musculoskeletal: Negative.   Psychiatric/Behavioral: Negative.       Physical Exam:  Vital Signs: BP (!) 145/89   Pulse 82   Temp 98.6 F (  37 C) (Oral)   Resp 18   Ht 5\' 2"  (1.575 m)   Wt 105.7 kg   BMI 42.62 kg/m   General: no acute distress.  HEENT: normocephalic, atraumatic Heart: regular rate & rhythm Lungs: normal respiratory effort Abdomen: soft, gravid, non-tender Pelvic:   External: Normal external female genitalia  Cervix: Dilation: 2 / Effacement (%): Thick / Station: -3    Extremities: non-tender, symmetric, mild edema bilaterally.  DTRs: 2+  Neurologic: Alert & oriented x 3.    Results for orders placed or performed during the hospital encounter of 03/07/24 (from the past 24 hours)  CBC     Status: Abnormal   Collection Time: 03/07/24 12:40 PM  Result Value Ref Range   WBC 8.3 4.0 - 10.5 K/uL   RBC 3.47 (L) 3.87 - 5.11 MIL/uL   Hemoglobin  10.9 (L) 12.0 - 15.0 g/dL   HCT 13.0 (L) 86.5 - 78.4 %   MCV 91.6 80.0 - 100.0 fL   MCH 31.4 26.0 - 34.0 pg   MCHC 34.3 30.0 - 36.0 g/dL   RDW 69.6 29.5 - 28.4 %   Platelets 168 150 - 400 K/uL   nRBC 0.0 0.0 - 0.2 %  Type and screen [facility]     Status: None   Collection Time: 03/07/24 12:40 PM  Result Value Ref Range   ABO/RH(D) A POS    Antibody Screen NEG    Sample Expiration      03/10/2024,2359 Performed at [facility], [address] [place], [place] [address]   Comprehensive metabolic panel     Status: Abnormal   Collection Time: 03/07/24 12:40 PM  Result Value Ref Range   Sodium 135 135 - 145 mmol/L   Potassium 3.6 3.5 - 5.1 mmol/L   Chloride 106 98 - 111 mmol/L   CO2 21 (L) 22 - 32 mmol/L   Glucose, Bld 79 70 - 99 mg/dL   BUN 12 6 - 20 mg/dL   Creatinine, Ser 0.10 0.44 - 1.00 mg/dL   Calcium 8.7 (L) 8.9 - 10.3 mg/dL   Total Protein 6.4 (L) 6.5 - 8.1 g/dL   Albumin 2.8 (L) 3.5 - 5.0 g/dL   AST 15 15 - 41 U/L   ALT 17 0 - 44 U/L   Alkaline Phosphatase 94 38 - 126 U/L   Total Bilirubin 0.6 0.0 - 1.2 mg/dL   GFR, Estimated >27 >25 mL/min   Anion gap 8 5 - 15  Protein / creatinine ratio, urine     Status: None   Collection Time: 03/07/24 12:41 PM  Result Value Ref Range   Creatinine, Urine 40 mg/dL   Total Protein, Urine <6 mg/dL   Protein Creatinine Ratio        0.00 - 0.15 mg/mg[Cre]    Pertinent Results:  Prenatal Labs: Blood type/Rh A POS   Antibody screen Negative    Rubella Immune (09/04 0000)   Varicella Immune  RPR Nonreactive (02/07 0000)   HBsAg Negative (09/04 0000)  Hep C Negative  HIV Non-reactive (02/07 0000)   GC Negative  Chlamydia Negative  Genetic screening cfDNA negative   1 hour GTT 163  3 hour GTT 99, 115, 138, 108  GBS Positive/-- (04/11 0000)    FHT:  FHR: 140 bpm, variability: moderate,  accelerations:  Present,  decelerations:  Absent Category/reactivity:  Category I UC:   irregular, every  3-7 minutes   Cephalic by SVE   No results found.  Assessment:  Anne Cochran is a 33 y.o. [id number] female at [redacted]w[redacted]d admitted for IOL d/t gHTN.   Plan:  1. Admit to Labor & Delivery - Consents reviewed and obtained - Dan Dun, MD  notified of admission and plan of care   2. Fetal Well being  - Fetal Tracing: Category I - Group B Streptococcus ppx  indicated: GBS positive  -Will plan to administer Cefazolin 2 g IV load followed by 1 gram every 8 hours               until delivery due to PCN allergy and resistant to Clinda.  - Presentation: Cephalic confirmed by SVE   3. Routine OB: - Prenatal labs reviewed, as above - Rh positive - CBC, T&S, RPR on admit - Clear liquid diet , continuous IV fluids  4. Induction of labor  - Contractions monitored with external toco - Pelvis adequate for trial of labor  - Plan for induction with misoprostol and cervical balloon  - Augmentation with oxytocin and AROM as appropriate  - Plan for  continuous fetal monitoring - Maternal pain control as desired; planning regional anesthesia - Anticipate vaginal delivery  5. gHTN - PIH labs collected (03/07/2024); Results: WNL - Antihypertensive Protocol in place  6. Post Partum Planning: - Infant feeding: breast feeding - Contraception: IUD- lng - Flu vaccine:  Declined - Tdap vaccine:  Declined  - RSV vaccine: N/A  Verdine Grenfell, CNM 03/07/24 6:54 PM  Kedric Bumgarner, CNM Certified Nurse Midwife [place]  [facility] OB[facility]N [facility]

## 2024-03-07 NOTE — Progress Notes (Signed)
 L&D Note    Subjective:  Patient stating that contractions are strong and is requesting an epidural. Objective:   Vitals:   03/07/24 1524 03/07/24 1740 03/07/24 2030 03/07/24 2205  BP: (!) 145/89  (!) 155/86 (!) 151/81  Pulse:   80 76  Resp:   19 17  Temp:  98.6 F (37 C) 98.7 F (37.1 C)   TempSrc:  Oral Oral   Weight:      Height:        Current Vital Signs 24h Vital Sign Ranges  T 98.7 F (37.1 C) Temp  Avg: 98.5 F (36.9 C)  Min: 98.2 F (36.8 C)  Max: 98.7 F (37.1 C)  BP (!) 151/81 BP  Min: 139/78  Max: 169/109  HR 76 Pulse  Avg: 75  Min: 62  Max: 82  RR 17 Resp  Avg: 18  Min: 17  Max: 19  SaO2     No data recorded      Gen: alert, cooperative, no distress FHR: Baseline: 145 bpm, Variability: moderate, Accels: Present, Decels: none Toco: regular, every 1-3 minutes; toco to be adjusted  SVE: Dilation: 2.5 Effacement (%): 50 Cervical Position: Posterior Station: -3 Presentation: Vertex Exam by:: RRL  Medications SCHEDULED MEDICATIONS   ammonia       lidocaine (PF)       misoprostol       oxytocin       oxytocin 40 units in LR 1000 mL  333 mL Intravenous Once   sodium chloride  flush  3 mL Intravenous Q12H    MEDICATION INFUSIONS   sodium chloride      [START ON 03/08/2024]  ceFAZolin (ANCEF) IV     lactated ringers     lactated ringers 125 mL/hr at 03/07/24 2024   oxytocin     oxytocin 4 milli-units/min (03/07/24 2245)    PRN MEDICATIONS  sodium chloride , acetaminophen , ammonia, fentaNYL (SUBLIMAZE) injection, labetalol **AND** labetalol **AND** labetalol **AND** hydrALAZINE **AND** Measure blood pressure, lactated ringers, lidocaine (PF), lidocaine (PF), misoprostol, ondansetron , oxytocin, sodium chloride  flush, sodium citrate-citric acid, terbutaline   Assessment & Plan:  33 y.o. [id number] at [redacted]w[redacted]d admitted for IOL due to gHTN.  -Labor: Satisfactory labor progress. Patient declined option for Cook's catheter placement to progress labor. Initiation of IV  Pitocin discussed with patient. Patient agreed to plan to start IV Pitocin. IV Pitocin started approximately at 2201. IV Pitocin currently infusing @ 4.  -Fetal Well-being: Category I -GBS: positive  - Received ceFAZolin x 1  -Membranes intact -Augmentation: IV Pitocin augmentation. Plan to increase IV Pitocin as appropriate.  -Analgesia: regional anesthesia   Rion Schnitzer, CNM  03/07/2024 11:43 PM  [facility]

## 2024-03-07 NOTE — Progress Notes (Signed)
 L&D Note    Subjective:  No unusual complaints. Patient is resting comfortably in bed. Reports contractions are a stronger than before and she is feeling them more.  Objective:   Vitals:   03/07/24 1256 03/07/24 1326 03/07/24 1524 03/07/24 1740  BP: (!) 153/97 (!) 148/101 (!) 145/89   Pulse: 77 82    Resp:      Temp:    98.6 F (37 C)  TempSrc:    Oral  Weight:      Height:        Current Vital Signs 24h Vital Sign Ranges  T 98.6 F (37 C) Temp  Avg: 98.4 F (36.9 C)  Min: 98.2 F (36.8 C)  Max: 98.6 F (37 C)  BP (!) 145/89 BP  Min: 139/78  Max: 169/109  HR 82 Pulse  Avg: 73.8  Min: 62  Max: 82  RR 18 Resp  Avg: 18  Min: 18  Max: 18  SaO2     No data recorded      Gen: alert, cooperative, no distress FHR: Baseline: 145 bpm, Variability: moderate, Accels: Present, Decels: none Toco: regular, every 1-3 minutes SVE: Dilation: 2 Effacement (%): Thick Cervical Position: Posterior Station: -3 Presentation: Vertex Exam by:: A Quinton Buckler RN  Medications SCHEDULED MEDICATIONS   ammonia       lidocaine (PF)       misoprostol       misoprostol  25 mcg Vaginal Q4H   oxytocin       oxytocin 40 units in LR 1000 mL  333 mL Intravenous Once   sodium chloride  flush  3 mL Intravenous Q12H    MEDICATION INFUSIONS   sodium chloride      [START ON 03/08/2024]  ceFAZolin (ANCEF) IV     Followed by   Cecily Cohen ON 03/08/2024]  ceFAZolin (ANCEF) IV     lactated ringers     lactated ringers 125 mL/hr at 03/07/24 1308   oxytocin      PRN MEDICATIONS  sodium chloride , acetaminophen , ammonia, fentaNYL (SUBLIMAZE) injection, labetalol **AND** labetalol **AND** labetalol **AND** hydrALAZINE **AND** Measure blood pressure, lactated ringers, lidocaine (PF), lidocaine (PF), misoprostol, ondansetron , oxytocin, sodium chloride  flush, sodium citrate-citric acid, terbutaline   Assessment & Plan:  33 y.o. [id number] at [redacted]w[redacted]d admitted for IOL due to gHTN.  -Labor: Satisfactory labor progress. Moderate  contractions palpated. Patient received oral/vaginal 25 mcg Cytotec x 2. Will plan to perform cervical exam approximately around 2100. Cook's catheter placement discussed with patient if cervix is unchanged from prior exam. Patient verbalized understanding and is considering this option.  -Fetal Well-being: Category I -GBS: positive  - Will plan to administer ceFAZolin when appropriate -Membranes intact -Intervention: continue present management and change maternal position -Analgesia: IVPM    Conchetta Lamia, CNM  03/07/2024 7:51 PM  [facility]

## 2024-03-08 ENCOUNTER — Encounter: Payer: Self-pay | Admitting: Obstetrics and Gynecology

## 2024-03-08 LAB — RPR: RPR Ser Ql: NONREACTIVE

## 2024-03-08 MED ORDER — DOCUSATE SODIUM 100 MG PO CAPS
100.0000 mg | ORAL_CAPSULE | Freq: Two times a day (BID) | ORAL | Status: DC
  Administered 2024-03-09: 100 mg via ORAL
  Filled 2024-03-08: qty 1

## 2024-03-08 MED ORDER — COCONUT OIL OIL
1.0000 | TOPICAL_OIL | Status: DC | PRN
  Administered 2024-03-08: 1 via TOPICAL
  Filled 2024-03-08: qty 7.5

## 2024-03-08 MED ORDER — LIDOCAINE-EPINEPHRINE (PF) 1.5 %-1:200000 IJ SOLN
INTRAMUSCULAR | Status: DC | PRN
  Administered 2024-03-07: 4 mL via EPIDURAL

## 2024-03-08 MED ORDER — HYDRALAZINE HCL 20 MG/ML IJ SOLN: 10.0000 mg | INTRAMUSCULAR | Status: DC | PRN

## 2024-03-08 MED ORDER — LABETALOL HCL 5 MG/ML IV SOLN: 80.0000 mg | INTRAVENOUS | Status: DC | PRN

## 2024-03-08 MED ORDER — LABETALOL HCL 5 MG/ML IV SOLN: 40.0000 mg | INTRAVENOUS | Status: DC | PRN

## 2024-03-08 MED ORDER — ONDANSETRON HCL 4 MG/2ML IJ SOLN: 4.0000 mg | INTRAMUSCULAR | Status: DC | PRN

## 2024-03-08 MED ORDER — ACETAMINOPHEN 500 MG PO TABS
1000.0000 mg | ORAL_TABLET | Freq: Four times a day (QID) | ORAL | Status: DC
  Administered 2024-03-08 – 2024-03-09 (×6): 1000 mg via ORAL
  Filled 2024-03-08 (×6): qty 2

## 2024-03-08 MED ORDER — DIBUCAINE (PERIANAL) 1 % EX OINT: 1.0000 | TOPICAL_OINTMENT | CUTANEOUS | Status: DC | PRN

## 2024-03-08 MED ORDER — IBUPROFEN 600 MG PO TABS
600.0000 mg | ORAL_TABLET | Freq: Four times a day (QID) | ORAL | Status: DC
  Administered 2024-03-08 – 2024-03-09 (×6): 600 mg via ORAL
  Filled 2024-03-08 (×6): qty 1

## 2024-03-08 MED ORDER — WITCH HAZEL-GLYCERIN EX PADS
1.0000 | MEDICATED_PAD | CUTANEOUS | Status: DC | PRN
  Administered 2024-03-08: 1 via TOPICAL
  Filled 2024-03-08: qty 100

## 2024-03-08 MED ORDER — SODIUM CHLORIDE 0.9% FLUSH
3.0000 mL | Freq: Two times a day (BID) | INTRAVENOUS | Status: DC
  Administered 2024-03-08: 3 mL via INTRAVENOUS

## 2024-03-08 MED ORDER — SODIUM CHLORIDE 0.9 % IV SOLN
INTRAVENOUS | Status: DC | PRN
  Administered 2024-03-07: 5 mL via EPIDURAL
  Administered 2024-03-08: 3 mL via EPIDURAL

## 2024-03-08 MED ORDER — PRENATAL MULTIVITAMIN CH
1.0000 | ORAL_TABLET | Freq: Every day | ORAL | Status: DC
  Administered 2024-03-08 – 2024-03-09 (×2): 1 via ORAL
  Filled 2024-03-08 (×2): qty 1

## 2024-03-08 MED ORDER — SIMETHICONE 80 MG PO CHEW: 80.0000 mg | CHEWABLE_TABLET | ORAL | Status: DC | PRN

## 2024-03-08 MED ORDER — FENTANYL-BUPIVACAINE-NACL 0.5-0.125-0.9 MG/250ML-% EP SOLN
EPIDURAL | Status: DC | PRN
  Administered 2024-03-08: 12 mL/h via EPIDURAL

## 2024-03-08 MED ORDER — FERROUS SULFATE 325 (65 FE) MG PO TABS
325.0000 mg | ORAL_TABLET | Freq: Two times a day (BID) | ORAL | Status: DC
  Administered 2024-03-08 – 2024-03-09 (×3): 325 mg via ORAL
  Filled 2024-03-08 (×3): qty 1

## 2024-03-08 MED ORDER — ONDANSETRON HCL 4 MG PO TABS: 4.0000 mg | ORAL_TABLET | ORAL | Status: DC | PRN

## 2024-03-08 MED ORDER — SODIUM CHLORIDE 0.9 % IV SOLN: 250.0000 mL | INTRAVENOUS | Status: DC | PRN

## 2024-03-08 MED ORDER — DIPHENHYDRAMINE HCL 25 MG PO CAPS: 25.0000 mg | ORAL_CAPSULE | Freq: Four times a day (QID) | ORAL | Status: DC | PRN

## 2024-03-08 MED ORDER — LABETALOL HCL 5 MG/ML IV SOLN: 20.0000 mg | INTRAVENOUS | Status: DC | PRN

## 2024-03-08 MED ORDER — LIDOCAINE HCL (PF) 1 % IJ SOLN
INTRAMUSCULAR | Status: DC | PRN
Start: 2024-03-07 — End: 2024-03-08
  Administered 2024-03-07: 3 mL via SUBCUTANEOUS

## 2024-03-08 MED ORDER — LABETALOL HCL 200 MG PO TABS
200.0000 mg | ORAL_TABLET | Freq: Two times a day (BID) | ORAL | Status: DC
  Administered 2024-03-08 – 2024-03-09 (×3): 200 mg via ORAL
  Filled 2024-03-08 (×3): qty 1

## 2024-03-08 MED ORDER — SODIUM CHLORIDE 0.9% FLUSH
3.0000 mL | INTRAVENOUS | Status: DC | PRN
  Administered 2024-03-08: 3 mL via INTRAVENOUS

## 2024-03-08 MED ORDER — BENZOCAINE-MENTHOL 20-0.5 % EX AERO
1.0000 | INHALATION_SPRAY | CUTANEOUS | Status: DC | PRN
  Administered 2024-03-08: 1 via TOPICAL
  Filled 2024-03-08: qty 56

## 2024-03-08 NOTE — Anesthesia Procedure Notes (Signed)
 Epidural Patient location during procedure: OB End time: 03/08/2024 12:03 AM  Staffing Performed: anesthesiologist   Preanesthetic Checklist Completed: patient identified, IV checked, site marked, risks and benefits discussed, surgical consent, monitors and equipment checked, pre-op evaluation and timeout performed  Epidural Patient position: sitting Prep: Betadine Patient monitoring: heart rate, continuous pulse ox and blood pressure Approach: midline Location: L4-L5 Injection technique: LOR saline  Needle:  Needle type: Tuohy  Needle gauge: 17 G Needle length: 9 cm and 9 Needle insertion depth: 7 cm Catheter type: closed end flexible Catheter size: 19 Gauge Catheter at skin depth: 13 cm Test dose: negative and 1.5% lidocaine with Epi 1:200 K  Assessment Sensory level: T10 Events: blood not aspirated, no cerebrospinal fluid, injection not painful, no injection resistance, no paresthesia and negative IV test  Additional Notes   Patient tolerated the insertion well without complications.-SATD -IVTD. No paresthesia. Refer to [facility] nursing for VS and dosingReason for block:procedure for pain

## 2024-03-08 NOTE — Progress Notes (Addendum)
 Post Partum Day 0  Subjective: Informed by RN regarding elevated BPs Denies HA, vision changes, or RUQ pain  Objective: BP 137/81 (BP Location: Left Arm)   Pulse 75   Temp 98 F (36.7 C) (Oral)   Resp 18   Ht 5\' 2"  (1.575 m)   Wt 105.7 kg   SpO2 99%   Breastfeeding Unknown   BMI 42.62 kg/m    Vitals:   03/08/24 0020 03/08/24 0025 03/08/24 0040 03/08/24 0055  BP: (!) 153/68 (!) 145/78 139/70 (!) 147/85   03/08/24 0110 03/08/24 0125 03/08/24 0320 03/08/24 0325  BP: (!) 144/81 (!) 140/77 (!) 154/96 (!) 161/76   03/08/24 0405 03/08/24 0630 03/08/24 0703 03/08/24 0831  BP: (!) 154/78 (!) 155/71 (!) 157/75 137/81     Recent Labs    03/07/24 1240  HGB 10.9*  HCT 31.8*  WBC 8.3  PLT 168    Assessment/Plan: 32 y.o. [id number] postpartum day # 0  1. GHTN - Dr. Cleora Daft updated on BPs and status - Labetalol 200mg  BID     LOS: 1 day   Joanny Dupree, CNM 03/08/2024, 8:55 AM

## 2024-03-08 NOTE — Anesthesia Preprocedure Evaluation (Signed)
 Anesthesia Evaluation  Patient identified by MRN, date of birth, ID band Patient awake    Reviewed: Allergy & Precautions, H&P , NPO status , Patient's Chart, lab work & pertinent test results, reviewed documented beta blocker date and time   Airway Mallampati: II  TM Distance: >3 FB Neck ROM: full    Dental no notable dental hx. (+) Teeth Intact   Pulmonary neg pulmonary ROS, former smoker   Pulmonary exam normal breath sounds clear to auscultation       Cardiovascular Exercise Tolerance: Good hypertension, On Medications  Rhythm:regular Rate:Normal     Neuro/Psych negative neurological ROS  negative psych ROS   GI/Hepatic negative GI ROS, Neg liver ROS,,,  Endo/Other  diabetes, Well Controlled, Gestational  Class 3 obesity  Renal/GU      Musculoskeletal   Abdominal   Peds  Hematology negative hematology ROS (+)   Anesthesia Other Findings   Reproductive/Obstetrics (+) Pregnancy                             Anesthesia Physical Anesthesia Plan  ASA: 3  Anesthesia Plan: Epidural   Post-op Pain Management:    Induction:   PONV Risk Score and Plan:   Airway Management Planned:   Additional Equipment:   Intra-op Plan:   Post-operative Plan:   Informed Consent: I have reviewed the patients History and Physical, chart, labs and discussed the procedure including the risks, benefits and alternatives for the proposed anesthesia with the patient or authorized representative who has indicated his/her understanding and acceptance.       Plan Discussed with:   Anesthesia Plan Comments:        Anesthesia Quick Evaluation

## 2024-03-08 NOTE — Lactation Note (Signed)
 This note was copied from a baby's chart. Lactation Consultation Note  Patient Name: Anne Cochran [id number] Date: 03/08/2024 Age:35 hours Reason for consult: Initial assessment;Early term 37-38.6wks   Maternal Data Does the patient have breastfeeding experience prior to this delivery?: Yes How long did the patient breastfeed?: 1-64months, 2-5 months  Initial assessment w/ a P3 patient and a 8hr old baby.  This was a SVD.  Patient w/ a hx of gHTN.  Patient stated that her goal is to breast/formula feed.  She stated that she did breastfeed her other children.  Patient stated that she didn't have a pump but was interested in a hand pump.   Feeding Mother's Current Feeding Choice: Breast Milk and Formula  Interventions Interventions: Education;Hand pump  Patient with lots of visitors in the room.  LC did not do a complete initial assessment.   LC did provide patient w/ a hand pump, and a Medicaid form to call her insurance to order a breastpump.    **Lactation will return at a later time when visitors are to complete assessment.** Discharge Pump: Advised to call insurance company  Consult Status Consult Status: Follow-up Follow-up type: In-patient    Laramie Gelles S Claud Gowan 03/08/2024, 11:36 AM

## 2024-03-08 NOTE — Progress Notes (Signed)
 Pt with BP cuff improperly placed.

## 2024-03-08 NOTE — Discharge Summary (Shared)
 Postpartum Discharge Summary  Patient Name: Anne Cochran DOB: 1991-04-13 MRN: [id number]  Date of admission: 03/07/2024 Delivery date:03/08/2024 Delivering provider: Ebbie Cochran Date of discharge: 03/09/2024  Primary OB: Anne Cochran LMP:No LMP recorded. EDC Estimated Date of Delivery: 03/21/24 Gestational Age at Delivery: [redacted]w[redacted]d   Admitting diagnosis: Gestational hypertension [O13.9] Intrauterine pregnancy: [redacted]w[redacted]d     Secondary diagnosis:   Principal Problem:   Gestational hypertension Active Problems:   Obesity affecting pregnancy   Anemia affecting pregnancy   NSVD (normal spontaneous vaginal delivery)   Discharge Diagnosis: Term Pregnancy Delivered and Gestational Hypertension      Hospital course: Induction of Labor With Vaginal Delivery   33 y.o. yo [phone] at [redacted]w[redacted]d was admitted to the hospital 03/07/2024 for induction of labor.  Indication for induction: Gestational hypertension.  Patient had an uncomplicated labor course.  Membrane Rupture Time/Date: 1:40 AM,03/08/2024  Delivery Method:Vaginal, Spontaneous Operative Delivery:N/A Episiotomy: None Lacerations:  None Details of delivery can be found in separate delivery note.  Patient had a postpartum course complicated by hypertension. She was started on oral Labetalol 200 mg BID. Blood pressures were controled with oral antihypertensives prior to discharge. Patient is discharged home 03/09/24.  Newborn Data: Birth date:03/08/2024 Birth time:2:58 AM Gender:Female Living status:Living Apgars:8 ,9  Weight:3040 g                                            Post partum procedures: None  Induction: Cytotec Augmentation: IV Pitocin Complications: None Delivery Type: spontaneous vaginal delivery Anesthesia: epidural anesthesia Placenta: spontaneous To Pathology: No   Prenatal Labs:  Blood type/Rh A POS   Antibody screen Negative    Rubella Immune (09/04 0000)   Varicella Immune  RPR Nonreactive (02/07 0000)   HBsAg  Negative (09/04 0000)  Hep C Negative  HIV Non-reactive (02/07 0000)   GC Negative  Chlamydia Negative  Genetic screening cfDNA negative   1 hour GTT 163  3 hour GTT 99, 115, 138, 108  GBS Positive/-- (04/11 0000)      Magnesium Sulfate received: No BMZ received: No Rhophylac:was not indicated MMR: was not indicated Varivax vaccine given: was not indicated - Tdap vaccine:  Declined  - Flu vaccine: Declined -RSV vaccine:N/A  Transfusion:No  Physical exam  Vitals:   03/08/24 1924 03/08/24 2310 03/09/24 0315 03/09/24 0800  BP: (!) 153/87 (!) 141/72 130/82 136/87  Pulse: 90 77 73 72  Resp: 18 18 18 18   Temp: 97.6 F (36.4 C) 97.8 F (36.6 C) (!) 97.4 F (36.3 C) 97.8 F (36.6 C)  TempSrc: Oral Oral Oral Oral  SpO2: 98% 99% 98% 99%  Weight:      Height:       General: alert, cooperative, and no distress Lochia: appropriate Uterine Fundus: firm Perineum:minimal edema/intact DVT Evaluation: No evidence of DVT seen on physical exam.  Labs: Lab Results  Component Value Date   WBC 8.0 03/09/2024   HGB 9.8 (L) 03/09/2024   HCT 29.1 (L) 03/09/2024   MCV 92.1 03/09/2024   PLT 138 (L) 03/09/2024      Latest Ref Rng & Units 03/07/2024   12:40 PM  CMP  Glucose 70 - 99 mg/dL 79   BUN 6 - 20 mg/dL 12   Creatinine 1.47 - 1.00 mg/dL 8.29   Sodium 562 - 130 mmol/L 135   Potassium 3.5 - 5.1 mmol/L 3.6  Chloride 98 - 111 mmol/L 106   CO2 22 - 32 mmol/L 21   Calcium 8.9 - 10.3 mg/dL 8.7   Total Protein 6.5 - 8.1 g/dL 6.4   Total Bilirubin 0.0 - 1.2 mg/dL 0.6   Alkaline Phos 38 - 126 U/L 94   AST 15 - 41 U/L 15   ALT 0 - 44 U/L 17    Edinburgh Score:    03/08/2024   11:10 PM  Edinburgh Postnatal Depression Scale Screening Tool  I have been able to laugh and see the funny side of things. 0  I have looked forward with enjoyment to things. 0  I have blamed myself unnecessarily when things went wrong. 0  I have been anxious or worried for no good reason. 2  I have  felt scared or panicky for no good reason. 0  Things have been getting on top of me. 0  I have been so unhappy that I have had difficulty sleeping. 0  I have felt sad or miserable. 0  I have been so unhappy that I have been crying. 0  The thought of harming myself has occurred to me. 0  Edinburgh Postnatal Depression Scale Total 2    Risk assessment for postpartum VTE and prophylactic treatment: Very high risk factors: None High risk factors: BMI 40-50 kg/m2 Moderate risk factors: None  Postpartum VTE prophylaxis with LMWH not indicated  After visit meds:  Allergies as of 03/09/2024       Reactions   Penicillins    Sulfa Antibiotics         Medication List     STOP taking these medications    aspirin EC 81 MG tablet       TAKE these medications    acetaminophen  500 MG tablet Commonly known as: TYLENOL  Take 2 tablets (1,000 mg total) by mouth every 6 (six) hours.   ferrous sulfate 325 (65 FE) MG tablet Take 1 tablet (325 mg total) by mouth 2 (two) times daily with a meal.   ibuprofen 600 MG tablet Commonly known as: ADVIL Take 1 tablet (600 mg total) by mouth every 6 (six) hours as needed for mild pain (pain score 1-3) or cramping.   labetalol 200 MG tablet Commonly known as: NORMODYNE Take 1 tablet (200 mg total) by mouth 2 (two) times daily.   prenatal multivitamin Tabs tablet Take 1 tablet by mouth daily at 12 noon.       Discharge home in stable condition Infant Feeding: Breast Infant Disposition:home with mother Discharge instruction: per After Visit Summary and Postpartum booklet. Activity: Advance as tolerated. Pelvic rest for 6 weeks.  Diet: routine diet Anticipated Birth Control:  Contraceptives: IUD -lng Postpartum Appointment:6 weeks Follow up Visit:  Follow-up Information     Center, [facility]. Go on 04/22/2024.   Specialty: General Practice Why: blood pressure check Contact information: [address] [place] [address] [phone]                 Plan:  [name] was discharged to home in good condition. Follow-up appointment as directed.    Signed:  Fraser Jackson, CNM Certified Nurse Midwife [facility]

## 2024-03-09 ENCOUNTER — Encounter: Payer: Self-pay | Admitting: Obstetrics and Gynecology

## 2024-03-09 ENCOUNTER — Other Ambulatory Visit: Payer: Self-pay

## 2024-03-09 DIAGNOSIS — O99019 Anemia complicating pregnancy, unspecified trimester: Secondary | ICD-10-CM | POA: Diagnosis present

## 2024-03-09 DIAGNOSIS — O99210 Obesity complicating pregnancy, unspecified trimester: Secondary | ICD-10-CM | POA: Diagnosis present

## 2024-03-09 LAB — CBC
HCT: 29.1 % — ABNORMAL LOW (ref 36.0–46.0)
Hemoglobin: 9.8 g/dL — ABNORMAL LOW (ref 12.0–15.0)
MCH: 31 pg (ref 26.0–34.0)
MCHC: 33.7 g/dL (ref 30.0–36.0)
MCV: 92.1 fL (ref 80.0–100.0)
Platelets: 138 10*3/uL — ABNORMAL LOW (ref 150–400)
RBC: 3.16 MIL/uL — ABNORMAL LOW (ref 3.87–5.11)
RDW: 14.7 % (ref 11.5–15.5)
WBC: 8 10*3/uL (ref 4.0–10.5)
nRBC: 0 % (ref 0.0–0.2)

## 2024-03-09 MED ORDER — PRENATAL MULTIVITAMIN CH: 1.0000 | ORAL_TABLET | Freq: Every day | ORAL | Status: DC

## 2024-03-09 MED ORDER — IBUPROFEN 600 MG PO TABS
600.0000 mg | ORAL_TABLET | Freq: Four times a day (QID) | ORAL | 0 refills | Status: AC | PRN
  Filled 2024-03-09: qty 30, 8d supply, fill #0

## 2024-03-09 MED ORDER — NIFEDIPINE ER OSMOTIC RELEASE 30 MG PO TB24
30.0000 mg | ORAL_TABLET | Freq: Every day | ORAL | Status: DC
  Filled 2024-03-09: qty 1

## 2024-03-09 MED ORDER — FERROUS SULFATE 325 (65 FE) MG PO TABS: 325.0000 mg | ORAL_TABLET | Freq: Two times a day (BID) | ORAL | Status: DC

## 2024-03-09 MED ORDER — ACETAMINOPHEN 500 MG PO TABS: 1000.0000 mg | ORAL_TABLET | Freq: Four times a day (QID) | ORAL | Status: AC

## 2024-03-09 MED ORDER — LABETALOL HCL 200 MG PO TABS
200.0000 mg | ORAL_TABLET | Freq: Two times a day (BID) | ORAL | 0 refills | Status: DC
  Filled 2024-03-09: qty 30, 15d supply, fill #0

## 2024-03-09 NOTE — Progress Notes (Signed)
 Postpartum Day  1  Subjective: 33 y.o. [id number] postpartum day #1 status post normal spontaneous vaginal delivery. She is ambulating, is tolerating po, is voiding spontaneously.  Her pain is well controlled on PO pain medications. Her lochia is less than menses.  Objective: BP 136/87 (BP Location: Left Arm)   Pulse 72   Temp 97.8 F (36.6 C) (Oral)   Resp 18   Ht 5\' 2"  (1.575 m)   Wt 105.7 kg   SpO2 99%   Breastfeeding Unknown   BMI 42.62 kg/m    Physical Exam:  General: alert, cooperative, and no distress Breasts: soft/nontender Pulm: nl effort Abdomen: soft, non-tender, active bowel sounds Uterine Fundus: firm Perineum: minimal edema, intact Lochia: appropriate DVT Evaluation: No evidence of DVT seen on physical exam.  Recent Labs    03/07/24 1240 03/09/24 0605  HGB 10.9* 9.8*  HCT 31.8* 29.1*  WBC 8.3 8.0  PLT 168 138*    Assessment/Plan: 33 y.o. [id number] postpartum day # 1  1. Continue routine postpartum care  2. Infant feeding status: breast and formula feeding -Lactation consult PRN for breastfeeding   3. Contraception plan: IUD  4. Maternal iron deficiency anemia in pregnancy - clinically not significant .  -Hemodynamically stable and asymptomatic -Intervention: continue on oral supplementation with ferrous sulfate 325  5. Immunization status:   all immunizations up to date  6. Gestational hypertension -BP's improving since starting Labetalol 200 mg BID  -Will continue on current dosage  -Discharge planned for later today  -Outpatient blood pressure check scheduled for Friday 03/11/2024 with primary prenatal provider  Disposition: continue inpatient postpartum care , plan for discharge today    LOS: 2 days   Anne Cochran, CNM 03/09/2024, 10:07 AM   ----- Fraser Jackson  Certified Nurse Midwife [facility]

## 2024-03-09 NOTE — Discharge Instructions (Signed)
 For blood pressure monitoring at home: Please check your blood pressure at home daily and write down what it is so that you can review it with your provider  Make sure you are able to sit for 15 minutes prior to taking it  If blood pressure 160/110 or greater repeat in 15 minutes.  If still 160/110 or greater come to the Emergency Department for evaluation.  If it goes down please contact the on call provider to check in.   Please go to the ED for severe range blood pressures (160/110 or higher), moderate to severe headache that is not relieved with Tylenol , chest pain, shortness of breath, changes in vision, or persistent pain in upper right abdomen.  Your medication may cause a mild headache or dizziness as you get used to it.  Please make sure that you are drinking plenty of water to help with this side effect.  Drinking 8-12 ounces of water with you medication, if you have a headache, and staying hydrated throughout the day can often help with side effects.    Vaginal Delivery, Care After Refer to this sheet in the next few weeks. These discharge instructions provide you with information on caring for yourself after delivery. Your caregiver may also give you specific instructions. Your treatment has been planned according to the most current medical practices available, but problems sometimes occur. Call your caregiver if you have any problems or questions after you go home. HOME CARE INSTRUCTIONS Take over-the-counter or prescription medicines only as directed by your caregiver or pharmacist. Do not drink alcohol, especially if you are breastfeeding or taking medicine to relieve pain. Do not smoke tobacco. Continue to use good perineal care. Good perineal care includes: Wiping your perineum from back to front Keeping your perineum clean. You can do sitz baths twice a day, to help keep this area clean Do not use tampons, douche or have sex until your caregiver says it is okay. Shower only and  avoid sitting in submerged water, aside from sitz baths Wear a well-fitting bra that provides breast support. Eat healthy foods. Drink enough fluids to keep your urine clear or pale yellow. Eat high-fiber foods such as whole grain cereals and breads, brown rice, beans, and fresh fruits and vegetables every day. These foods may help prevent or relieve constipation. Avoid constipation with high fiber foods or medications, such as miralax or metamucil Follow your caregiver's recommendations regarding resumption of activities such as climbing stairs, driving, lifting, exercising, or traveling. Talk to your caregiver about resuming sexual activities. Resumption of sexual activities is dependent upon your risk of infection, your rate of healing, and your comfort and desire to resume sexual activity. Try to have someone help you with your household activities and your newborn for at least a few days after you leave the hospital. Rest as much as possible. Try to rest or take a nap when your newborn is sleeping. Increase your activities gradually. Keep all of your scheduled postpartum appointments. It is very important to keep your scheduled follow-up appointments. At these appointments, your caregiver will be checking to make sure that you are healing physically and emotionally. SEEK MEDICAL CARE IF:  You are passing large clots from your vagina. Save any clots to show your caregiver. You have a foul smelling discharge from your vagina. You have trouble urinating. You are urinating frequently. You have pain when you urinate. You have a change in your bowel movements. You have increasing redness, pain, or swelling near your vaginal  incision (episiotomy) or vaginal tear. You have pus draining from your episiotomy or vaginal tear. Your episiotomy or vaginal tear is separating. You have painful, hard, or reddened breasts. You have a severe headache. You have blurred vision or see spots. You feel sad or  depressed. You have thoughts of hurting yourself or your newborn. You have questions about your care, the care of your newborn, or medicines. You are dizzy or light-headed. You have a rash. You have nausea or vomiting. You were breastfeeding and have not had a menstrual period within 12 weeks after you stopped breastfeeding. You are not breastfeeding and have not had a menstrual period by the 12th week after delivery. You have a fever. SEEK IMMEDIATE MEDICAL CARE IF:  You have persistent pain. You have chest pain. You have shortness of breath. You faint. You have leg pain. You have stomach pain. Your vaginal bleeding saturates two or more sanitary pads in 1 hour. MAKE SURE YOU:  Understand these instructions. Will watch your condition. Will get help right away if you are not doing well or get worse. Document Released: 10/24/2000 Document Revised: 03/13/2014 Document Reviewed: 06/23/2012 [facility] Patient Information 2015 [place], [place]. This information is not intended to replace advice given to you by your health care provider. Make sure you discuss any questions you have with your health care provider.  Sitz Bath A sitz bath is a warm water bath taken in the sitting position. The water covers only the hips and butt (buttocks). We recommend using one that fits in the toilet, to help with ease of use and cleanliness. It may be used for either healing or cleaning purposes. Sitz baths are also used to relieve pain, itching, or muscle tightening (spasms). The water may contain medicine. Moist heat will help you heal and relax.  HOME CARE  Take 3 to 4 sitz baths a day. Fill the bathtub half-full with warm water. Sit in the water and open the drain a little. Turn on the warm water to keep the tub half-full. Keep the water running constantly. Soak in the water for 15 to 20 minutes. After the sitz bath, pat the affected area dry. GET HELP RIGHT AWAY IF: You get worse instead of better.  Stop the sitz baths if you get worse. MAKE SURE YOU: Understand these instructions. Will watch your condition. Will get help right away if you are not doing well or get worse. Document Released: 12/04/2004 Document Revised: 07/21/2012 Document Reviewed: 02/24/2011 [facility] Patient Information 2015 [place], [place]. This information is not intended to replace advice given to you by your health care provider. Make sure you discuss any questions you have with your health care provider.  After Your Delivery Discharge Instructions  After Discharge Orders: No future appointments.   Allergies as of 03/09/2024       Reactions   Penicillins    Sulfa Antibiotics         Medication List     STOP taking these medications    aspirin EC 81 MG tablet       TAKE these medications    acetaminophen  500 MG tablet Commonly known as: TYLENOL  Take 2 tablets (1,000 mg total) by mouth every 6 (six) hours.   ferrous sulfate 325 (65 FE) MG tablet Take 1 tablet (325 mg total) by mouth 2 (two) times daily with a meal.   ibuprofen 600 MG tablet Commonly known as: ADVIL Take 1 tablet (600 mg total) by mouth every 6 (six) hours as needed for  mild pain (pain score 1-3) or cramping.   labetalol 200 MG tablet Commonly known as: NORMODYNE Take 1 tablet (200 mg total) by mouth 2 (two) times daily.   prenatal multivitamin Tabs tablet Take 1 tablet by mouth daily at 12 noon.       Medical equipment:  hand pump   Call the Physician with any C-Section signs and symptoms:  Warning signs regarding incision: "Popping" of stitches or staples Foul smelling discharge or pus More redness or streaks around incision than before   After your delivery - signs and symptoms to watch for: Fever - Oral temperature greater than 100.4 degrees Fahrenheit Foul-smelling vaginal discharge Headache unrelieved by "pain meds" Difficulty urinating Breasts reddened, hard, hot to the touch Nipple discharge which is  foul-smelling or contains pus Difficulty breathing with or without chest pain New calf pain especially if only on one side Sudden, continuing increased vaginal bleeding with or without clots Unrelieved feelings of: Inability to cope Sadness Anxiety Lack of interest in baby Insomnia Crying   What to do at home: See patient education handouts for full information Resume activity gradually  Don't lift anything heavier than baby and carrier until OK'd by your Physician or Midwife No sex until OK'd by your Physician or Midwife Take care of yourself by sleeping/resting as much as possible Eat regular nutritious meals Let someone else care for you, your baby, and housework as much as possible  Take pain medication as prescribed whenever you need them Wear compression stockings if prescribed  To avoid/relieve constipation take stool softeners if advised  Drink lots of water/fruit juices Increase fiber in your diet Breast care: Wear support bra 24/7; use lanolin ointment/cream as needed   Refer to Newborn Discharge Instructions for problems or follow-up regarding feeding or nursing

## 2024-03-09 NOTE — Anesthesia Postprocedure Evaluation (Signed)
 Anesthesia Post Note  Patient: Anne Cochran  Procedure(s) Performed: AN AD HOC LABOR EPIDURAL  Patient location during evaluation: Mother Baby Anesthesia Type: Epidural Level of consciousness: awake and alert Pain management: pain level controlled Vital Signs Assessment: post-procedure vital signs reviewed and stable Respiratory status: spontaneous breathing, nonlabored ventilation and respiratory function stable Cardiovascular status: stable Postop Assessment: no headache, no backache, epidural receding and able to ambulate Anesthetic complications: no   No notable events documented.   Last Vitals:  Vitals:   03/08/24 2310 03/09/24 0315  BP: (!) 141/72 130/82  Pulse: 77 73  Resp: 18 18  Temp: 36.6 C (!) 36.3 C  SpO2: 99% 98%    Last Pain:  Vitals:   03/09/24 0315  TempSrc: Oral  PainSc: 0-No pain                 Sheliah Deutscher C

## 2024-03-09 NOTE — Lactation Note (Signed)
 This note was copied from a baby's chart. Lactation Consultation Note  Patient Name: Anne Cochran [id number] Date: 03/09/2024 Age:35 hours Reason for consult: Follow-up assessment;Early term 37-38.6wks   Maternal Data Follow up assessment/discharge education w/ 29hr old baby Anne and mom.  Patient expressed that she feels breastfeeding is going well.  She has given infant a bottle a couple of times but still pushing to breastfeed.    MOB stated that her breast are feeling a little bit heavier compared to yesterday.  Feeding Mother's Current Feeding Choice: Breast Milk and Formula Nipple Type: Slow - flow  LC and patient attempted to put infant to the breast for a feeding.  After a couple of attempts infant did not wake.  LC encouraged patient to try again in 30 minutes.   Interventions Interventions: Breast feeding basics reviewed;Education;CDC milk storage guidelines  Discharge Discharge Education: Engorgement and breast care;Warning signs for feeding baby;Outpatient recommendation  Education on engorgement prevention/treatment was discussed as well as breastmilk storage guidelines.  LC provided patient with a handout on breastmilk storage guidelines from [facility]. [facility] outpatient lactation services phone number written on the white board in the room.  Patient verbalized understanding.  LC also provided education from the postpartum book about warning signs to look for in a poor feeding.    Consult Status Consult Status: Complete Follow-up type: Call as needed    Anne Cochran S [name] 03/09/2024, 11:03 AM

## 2024-11-26 ENCOUNTER — Encounter: Payer: Self-pay | Admitting: Emergency Medicine

## 2024-11-26 ENCOUNTER — Ambulatory Visit
Admission: EM | Admit: 2024-11-26 | Discharge: 2024-11-26 | Disposition: A | Discharge status: Home / Self Care | Attending: Family Medicine | Admitting: Family Medicine

## 2024-11-26 DIAGNOSIS — L03011 Cellulitis of right finger: Secondary | ICD-10-CM

## 2024-11-26 MED ORDER — MUPIROCIN 2 % EX OINT: TOPICAL_OINTMENT | CUTANEOUS | 0 refills | Status: AC

## 2024-11-26 MED ORDER — DOXYCYCLINE HYCLATE 100 MG PO TABS: 100.0000 mg | ORAL_TABLET | Freq: Two times a day (BID) | ORAL | 0 refills | Status: AC

## 2024-11-26 MED ORDER — MUPIROCIN CALCIUM 2 % EX CREA: 1.0000 | TOPICAL_CREAM | Freq: Three times a day (TID) | CUTANEOUS | 0 refills | Status: DC

## 2024-11-26 NOTE — ED Triage Notes (Addendum)
 Patient c/o redness, swelling and pain at her right ring finger cuticle for the past 2 weeks.  Patient reports some drainage from the site.  Patient denies fevers.SABRA

## 2024-11-26 NOTE — ED Provider Notes (Addendum)
 " [facility]    CSN: [id number] Arrival date & time: 11/26/24  1005      History   Chief Complaint Chief Complaint  Patient presents with   Finger Injury    Right ring finger    HPI  34 year old female presents for evaluation of the above.  Redness and some drainage around the nailbed of the right ring finger.  Has been going on for the past couple of weeks.  Started after she bit off a hangnail.  No fever.  She has been soaking the hand.  Pain currently 5/10 in severity.    Home Medications    Prior to Admission medications  Medication Sig Start Date End Date Taking? Authorizing Provider  doxycycline  (VIBRA -TABS) 100 MG tablet Take 1 tablet (100 mg total) by mouth 2 (two) times daily. 11/26/24  Yes Kenyada Hy G, DO  mupirocin  ointment (BACTROBAN ) 2 % Apply a small amount (1 g) to the right ring finger 3 times a day for 7 days. 11/26/24  Yes Serenitee Fuertes G, DO  acetaminophen  (TYLENOL ) 500 MG tablet Take 2 tablets (1,000 mg total) by mouth every 6 (six) hours. 03/09/24   Vernel Therisa HERO, CNM  ibuprofen  (ADVIL ) 600 MG tablet Take 1 tablet (600 mg total) by mouth every 6 (six) hours as needed for mild pain (pain score 1-3) or cramping. 03/09/24   Vernel Therisa HERO, CNM    Family History Family History  Problem Relation Age of Onset   Cancer Mother 57   Diabetes Mother    Hypertension Mother    Thyroid disease Mother    Breast cancer Maternal Grandmother 59   Cancer Maternal Grandmother    Diabetes Maternal Grandmother    Heart attack Maternal Grandfather     Social History Social History[1]   Allergies   Penicillins and Sulfa antibiotics   Review of Systems Review of Systems Per HPI  Physical Exam Triage Vital Signs ED Triage Vitals  Encounter Vitals Group     BP 11/26/24 1020 132/85     Girls Systolic BP Percentile --      Girls Diastolic BP Percentile --      Boys Systolic BP Percentile --      Boys Diastolic BP Percentile --      Pulse Rate  11/26/24 1020 78     Resp 11/26/24 1020 14     Temp 11/26/24 1020 98.1 F (36.7 C)     Temp Source 11/26/24 1020 Oral     SpO2 11/26/24 1020 96 %     Weight 11/26/24 1019 235 lb (106.6 kg)     Height 11/26/24 1019 5' 2 (1.575 m)     Head Circumference --      Peak Flow --      Pain Score 11/26/24 1018 5     Pain Loc --      Pain Education --      Exclude from Growth Chart --    No data found.  Updated Vital Signs BP 132/85 (BP Location: Right Arm)   Pulse 78   Temp 98.1 F (36.7 C) (Oral)   Resp 14   Ht 5' 2 (1.575 m)   Wt 106.6 kg   SpO2 96%   Breastfeeding No   BMI 42.98 kg/m   Visual Acuity Right Eye Distance:   Left Eye Distance:   Bilateral Distance:    Right Eye Near:   Left Eye Near:    Bilateral Near:  Physical Exam Vitals and nursing note reviewed.  Constitutional:      General: She is not in acute distress. HENT:     Head: Normocephalic and atraumatic.  Pulmonary:     Effort: Pulmonary effort is normal. No respiratory distress.  Skin:    Comments: Right ring finger with erythema around the nailbed.  There is some purulence when pressure is applied.  Neurological:     Mental Status: She is alert.      UC Treatments / Results  Labs (all labs ordered are listed, but only abnormal results are displayed) Labs Reviewed - No data to display  EKG   Radiology No results found.  Procedures Procedures (including critical care time)  Medications Ordered in UC Medications - No data to display  Initial Impression / Assessment and Plan / UC Course  I have reviewed the triage vital signs and the nursing notes.  Pertinent labs & imaging results that were available during my care of the patient were reviewed by me and considered in my medical decision making (see chart for details).    34 year old female presents with paronychia.  No discrete area to drain today.  Doxycycline  and Bactroban  as prescribed.  Final Clinical Impressions(s) / UC  Diagnoses   Final diagnoses:  Paronychia of finger of right hand   Discharge Instructions   None    ED Prescriptions     Medication Sig Dispense Auth. Provider   doxycycline  (VIBRA -TABS) 100 MG tablet Take 1 tablet (100 mg total) by mouth 2 (two) times daily. 20 tablet [place], Meela Wareing G, DO   mupirocin  cream (BACTROBAN ) 2 %  (Status: Discontinued) Apply 1 Application topically 3 (three) times daily. 30 g Josten Warmuth G, DO   mupirocin  ointment (BACTROBAN ) 2 % Apply a small amount (1 g) to the right ring finger 3 times a day for 7 days. 30 g Jenissa Tyrell G, DO      PDMP not reviewed this encounter.      [1]  Social History Tobacco Use   Smoking status: Former    Current packs/day: 0.00    Types: Cigarettes    Quit date: 10/10/2020    Years since quitting: 4.1   Smokeless tobacco: Never  Vaping Use   Vaping status: Former   Quit date: 12/11/2020   Substances: Nicotine  Substance Use Topics   Alcohol use: Not Currently    Comment: occ, no etoh since 04/2021   Drug use: Not Currently     Janus Vlcek G, DO 11/26/24 1100  "
